# Patient Record
Sex: Female | Born: 1975
Health system: Southern US, Community
[De-identification: ages and names within clinical notes are randomized; demographics above are authoritative.]

## PROBLEM LIST (undated history)

## (undated) DIAGNOSIS — N200 Calculus of kidney: Secondary | ICD-10-CM

## (undated) DIAGNOSIS — N261 Atrophy of kidney (terminal): Secondary | ICD-10-CM

## (undated) DIAGNOSIS — K5090 Crohn's disease, unspecified, without complications: Secondary | ICD-10-CM

## (undated) HISTORY — PX: URETHRA SURGERY: SHX824

## (undated) HISTORY — PX: CARPAL TUNNEL RELEASE: SHX101

## (undated) HISTORY — DX: Atrophy of kidney (terminal): N26.1

## (undated) HISTORY — DX: Calculus of kidney: N20.0

## (undated) HISTORY — PX: OTHER SURGICAL HISTORY: SHX169

## (undated) HISTORY — DX: Crohn's disease, unspecified, without complications: K50.90

---

## 2010-05-05 DIAGNOSIS — K219 Gastro-esophageal reflux disease without esophagitis: Secondary | ICD-10-CM | POA: Insufficient documentation

## 2010-05-05 DIAGNOSIS — N390 Urinary tract infection, site not specified: Secondary | ICD-10-CM | POA: Insufficient documentation

## 2010-05-05 DIAGNOSIS — G5600 Carpal tunnel syndrome, unspecified upper limb: Secondary | ICD-10-CM | POA: Insufficient documentation

## 2010-05-05 DIAGNOSIS — R319 Hematuria, unspecified: Secondary | ICD-10-CM | POA: Insufficient documentation

## 2010-05-05 DIAGNOSIS — F5104 Psychophysiologic insomnia: Secondary | ICD-10-CM | POA: Insufficient documentation

## 2010-05-22 DIAGNOSIS — Z0000 Encounter for general adult medical examination without abnormal findings: Secondary | ICD-10-CM | POA: Insufficient documentation

## 2010-06-19 DIAGNOSIS — M545 Low back pain, unspecified: Secondary | ICD-10-CM | POA: Insufficient documentation

## 2010-06-22 DIAGNOSIS — K529 Noninfective gastroenteritis and colitis, unspecified: Secondary | ICD-10-CM | POA: Insufficient documentation

## 2010-07-06 DIAGNOSIS — F419 Anxiety disorder, unspecified: Secondary | ICD-10-CM | POA: Insufficient documentation

## 2010-10-07 DIAGNOSIS — M25519 Pain in unspecified shoulder: Secondary | ICD-10-CM | POA: Insufficient documentation

## 2010-10-07 DIAGNOSIS — B070 Plantar wart: Secondary | ICD-10-CM | POA: Insufficient documentation

## 2015-11-19 DIAGNOSIS — K50919 Crohn's disease, unspecified, with unspecified complications: Secondary | ICD-10-CM | POA: Diagnosis not present

## 2016-01-27 DIAGNOSIS — K50919 Crohn's disease, unspecified, with unspecified complications: Secondary | ICD-10-CM | POA: Diagnosis not present

## 2016-02-02 DIAGNOSIS — K5080 Crohn's disease of both small and large intestine without complications: Secondary | ICD-10-CM | POA: Diagnosis not present

## 2016-03-23 DIAGNOSIS — K50919 Crohn's disease, unspecified, with unspecified complications: Secondary | ICD-10-CM | POA: Diagnosis not present

## 2016-03-26 DIAGNOSIS — K5080 Crohn's disease of both small and large intestine without complications: Secondary | ICD-10-CM | POA: Diagnosis not present

## 2016-03-31 DIAGNOSIS — B070 Plantar wart: Secondary | ICD-10-CM | POA: Diagnosis not present

## 2016-03-31 DIAGNOSIS — R300 Dysuria: Secondary | ICD-10-CM | POA: Diagnosis not present

## 2016-03-31 DIAGNOSIS — L239 Allergic contact dermatitis, unspecified cause: Secondary | ICD-10-CM | POA: Diagnosis not present

## 2016-03-31 DIAGNOSIS — K50919 Crohn's disease, unspecified, with unspecified complications: Secondary | ICD-10-CM | POA: Diagnosis not present

## 2016-04-05 DIAGNOSIS — D649 Anemia, unspecified: Secondary | ICD-10-CM | POA: Diagnosis not present

## 2016-04-05 DIAGNOSIS — N390 Urinary tract infection, site not specified: Secondary | ICD-10-CM | POA: Diagnosis not present

## 2016-04-05 DIAGNOSIS — L239 Allergic contact dermatitis, unspecified cause: Secondary | ICD-10-CM | POA: Diagnosis not present

## 2016-04-05 DIAGNOSIS — J069 Acute upper respiratory infection, unspecified: Secondary | ICD-10-CM | POA: Diagnosis not present

## 2016-04-15 DIAGNOSIS — K5080 Crohn's disease of both small and large intestine without complications: Secondary | ICD-10-CM | POA: Diagnosis not present

## 2016-04-15 DIAGNOSIS — K589 Irritable bowel syndrome without diarrhea: Secondary | ICD-10-CM | POA: Diagnosis not present

## 2016-05-19 DIAGNOSIS — K50919 Crohn's disease, unspecified, with unspecified complications: Secondary | ICD-10-CM | POA: Diagnosis not present

## 2016-07-12 DIAGNOSIS — K50919 Crohn's disease, unspecified, with unspecified complications: Secondary | ICD-10-CM | POA: Diagnosis not present

## 2016-08-02 DIAGNOSIS — R7989 Other specified abnormal findings of blood chemistry: Secondary | ICD-10-CM | POA: Diagnosis not present

## 2016-08-02 DIAGNOSIS — K5080 Crohn's disease of both small and large intestine without complications: Secondary | ICD-10-CM | POA: Diagnosis not present

## 2016-08-02 DIAGNOSIS — K219 Gastro-esophageal reflux disease without esophagitis: Secondary | ICD-10-CM | POA: Diagnosis not present

## 2016-09-14 DIAGNOSIS — K50919 Crohn's disease, unspecified, with unspecified complications: Secondary | ICD-10-CM | POA: Diagnosis not present

## 2016-09-28 DIAGNOSIS — Z0001 Encounter for general adult medical examination with abnormal findings: Secondary | ICD-10-CM | POA: Diagnosis not present

## 2016-09-28 DIAGNOSIS — F17210 Nicotine dependence, cigarettes, uncomplicated: Secondary | ICD-10-CM | POA: Diagnosis not present

## 2016-09-28 DIAGNOSIS — R0609 Other forms of dyspnea: Secondary | ICD-10-CM | POA: Diagnosis not present

## 2016-10-04 DIAGNOSIS — H5213 Myopia, bilateral: Secondary | ICD-10-CM | POA: Diagnosis not present

## 2016-10-13 DIAGNOSIS — Z13220 Encounter for screening for lipoid disorders: Secondary | ICD-10-CM | POA: Diagnosis not present

## 2016-11-09 DIAGNOSIS — Z1231 Encounter for screening mammogram for malignant neoplasm of breast: Secondary | ICD-10-CM | POA: Diagnosis not present

## 2016-11-23 DIAGNOSIS — K50919 Crohn's disease, unspecified, with unspecified complications: Secondary | ICD-10-CM | POA: Diagnosis not present

## 2016-12-21 DIAGNOSIS — N390 Urinary tract infection, site not specified: Secondary | ICD-10-CM | POA: Diagnosis not present

## 2016-12-31 DIAGNOSIS — N390 Urinary tract infection, site not specified: Secondary | ICD-10-CM | POA: Diagnosis not present

## 2017-01-25 DIAGNOSIS — K50919 Crohn's disease, unspecified, with unspecified complications: Secondary | ICD-10-CM | POA: Diagnosis not present

## 2017-02-03 DIAGNOSIS — K219 Gastro-esophageal reflux disease without esophagitis: Secondary | ICD-10-CM | POA: Diagnosis not present

## 2017-02-03 DIAGNOSIS — Z862 Personal history of diseases of the blood and blood-forming organs and certain disorders involving the immune mechanism: Secondary | ICD-10-CM | POA: Diagnosis not present

## 2017-02-03 DIAGNOSIS — K5080 Crohn's disease of both small and large intestine without complications: Secondary | ICD-10-CM | POA: Diagnosis not present

## 2017-03-22 DIAGNOSIS — K50919 Crohn's disease, unspecified, with unspecified complications: Secondary | ICD-10-CM | POA: Diagnosis not present

## 2017-05-18 DIAGNOSIS — K50919 Crohn's disease, unspecified, with unspecified complications: Secondary | ICD-10-CM | POA: Diagnosis not present

## 2017-07-13 DIAGNOSIS — K50919 Crohn's disease, unspecified, with unspecified complications: Secondary | ICD-10-CM | POA: Diagnosis not present

## 2017-10-28 ENCOUNTER — Ambulatory Visit: Payer: BLUE CROSS/BLUE SHIELD | Admitting: Family Medicine

## 2017-10-28 ENCOUNTER — Encounter: Payer: Self-pay | Admitting: Family Medicine

## 2017-10-28 VITALS — BP 112/80 | HR 72 | Temp 97.7°F | Ht 61.0 in | Wt 129.0 lb

## 2017-10-28 DIAGNOSIS — K5090 Crohn's disease, unspecified, without complications: Secondary | ICD-10-CM

## 2017-10-28 DIAGNOSIS — N3001 Acute cystitis with hematuria: Secondary | ICD-10-CM | POA: Diagnosis not present

## 2017-10-28 DIAGNOSIS — Z7689 Persons encountering health services in other specified circumstances: Secondary | ICD-10-CM

## 2017-10-28 DIAGNOSIS — R82998 Other abnormal findings in urine: Secondary | ICD-10-CM

## 2017-10-28 LAB — POC URINALSYSI DIPSTICK (AUTOMATED)
Bilirubin, UA: NEGATIVE
GLUCOSE UA: NEGATIVE
KETONES UA: NEGATIVE
Nitrite, UA: NEGATIVE
Protein, UA: POSITIVE — AB
SPEC GRAV UA: 1.015 (ref 1.010–1.025)
Urobilinogen, UA: 0.2 E.U./dL
pH, UA: 6 (ref 5.0–8.0)

## 2017-10-28 MED ORDER — SULFAMETHOXAZOLE-TRIMETHOPRIM 800-160 MG PO TABS: 1.0000 | ORAL_TABLET | Freq: Two times a day (BID) | ORAL | 0 refills | Status: AC

## 2017-10-28 NOTE — Patient Instructions (Addendum)
Crohn Disease Crohn disease is a long-lasting (chronic) disease that affects your gastrointestinal (GI) tract. It often causes irritation and swelling (inflammation) in your small intestine and the beginning of your large intestine. However, it can affect any part of your GI tract. Crohn disease is part of a group of illnesses that are known as inflammatory bowel disease (IBD). Crohn disease may start slowly and get worse over time. Symptoms may come and go. They may also disappear for months or even years at a time (remission). What are the causes? The exact cause of Crohn disease is not known. It may be a response that causes your body's defense system (immune system) to mistakenly attack healthy cells and tissues (autoimmune response). Your genes and your environment may also play a role. What increases the risk? You may be at greater risk for Crohn disease if you:  Have other family members with Crohn disease or another IBD.  Use any tobacco products, including cigarettes, chewing tobacco, or electronic cigarettes.  Are in your 78s.  Have [place] European ancestry.  What are the signs or symptoms? The main signs and symptoms of Crohn disease involve your GI tract. These include:  Diarrhea.  Rectal bleeding.  An urgent need to move your bowels.  The feeling that you are not finished having a bowel movement.  Abdominal pain or cramping.  Constipation.  General signs and symptoms of Crohn disease may also include:  Unexplained weight loss.  Fatigue.  Fever.  Nausea.  Loss of appetite.  Joint pain  Changes in vision.  Red bumps on your skin.  How is this diagnosed? Your health care provider may suspect Crohn disease based on your symptoms and your medical history. Your health care provider will do a physical exam. You may need to see a health care provider who specializes in diseases of the digestive tract (gastroenterologist). You may also have tests to help your  health care providers make a diagnosis. These may include:  Blood tests.  Stool sample tests.  Imaging tests, such as X-rays and CT scans.  Tests to examine the inside of your intestines using a long, flexible tube that has a light and a camera on the end (endoscopy or colonoscopy).  A procedure to take tissue samples from inside your bowel (biopsy) to be examined under a microscope.  How is this treated? There is no cure for Crohn disease. Treatment will focus on managing your symptoms. Crohn disease affects each person differently. Your treatment may include:  Resting your bowels. Drinking only clear liquids or getting nutrition through an IV for a period of time gives your bowels a chance to heal because they are not passing stools.  Medicines. These may be used alone or in combination (combination therapy). These may include antibiotic medicines. You may be given medicines that help to: ? Reduce inflammation. ? Control your immune system activity. ? Fight infections. ? Relieve cramps and prevent diarrhea. ? Control your pain.  Surgery. You may need surgery if: ? Medicines and other treatments are no longer working. ? You develop complications from severe Crohn disease. ? A section of your intestine becomes so damaged that it needs to be removed.  Follow these instructions at home:  Take medicines only as directed by your health care provider.  If you were prescribed an antibiotic medicine, finish it all even if you start to feel better.  Keep all follow-up visits as directed by your health care provider. This is important.  Talk with your  health care provider about changing your diet. This may help your symptoms. Your health care provide may recommend changes, such as: ? Drinking more fluids. ? Avoiding milk and other foods that contain lactose. ? Eating a low-fat diet. ? Avoiding high-fiber foods, such as popcorn and nuts. ? Avoiding carbonated beverages, such as  soda. ? Eating smaller meals more often rather than eating large meals. ? Keeping a food diary to identify foods that make your symptoms better or worse.  Do not use any tobacco products, including cigarettes, chewing tobacco, or electronic cigarettes. If you need help quitting, ask your health care provider.  Limit alcohol intake to no more than 1 drink per day for nonpregnant women and 2 drinks per day for men. One drink equals 12 ounces of beer, 5 ounces of wine, or 1 ounces of hard liquor.  Exercise daily or as directed by your health care provider. Contact a health care provider if:  You have diarrhea, abdominal cramps, and other gastrointestinal problems that are present almost all of the time.  Your symptoms do not improve with treatment.  You continue to lose weight.  You develop a rash or sores on your skin.  You develop eye problems.  You have a fever.  Your symptoms get worse.  You develop new symptoms. Get help right away if:  You have bloody diarrhea.  You develop severe abdominal pain.  You cannot pass stools. This information is not intended to replace advice given to you by your health care provider. Make sure you discuss any questions you have with your health care provider. Document Released: 02/10/2005 Document Revised: 09/11/2015 Document Reviewed: 12/19/2013 Elsevier Interactive Patient Education  2018 [employer].  Urinary Tract Infection, Adult A urinary tract infection (UTI) is an infection of any part of the urinary tract, which includes the kidneys, ureters, bladder, and urethra. These organs make, store, and get rid of urine in the body. UTI can be a bladder infection (cystitis) or kidney infection (pyelonephritis). What are the causes? This infection may be caused by fungi, viruses, or bacteria. Bacteria are the most common cause of UTIs. This condition can also be caused by repeated incomplete emptying of the bladder during urination. What  increases the risk? This condition is more likely to develop if:  You ignore your need to urinate or hold urine for long periods of time.  You do not empty your bladder completely during urination.  You wipe back to front after urinating or having a bowel movement, if you are female.  You are uncircumcised, if you are female.  You are constipated.  You have a urinary catheter that stays in place (indwelling).  You have a weak defense (immune) system.  You have a medical condition that affects your bowels, kidneys, or bladder.  You have diabetes.  You take antibiotic medicines frequently or for long periods of time, and the antibiotics no longer work well against certain types of infections (antibiotic resistance).  You take medicines that irritate your urinary tract.  You are exposed to chemicals that irritate your urinary tract.  You are female.  What are the signs or symptoms? Symptoms of this condition include:  Fever.  Frequent urination or passing small amounts of urine frequently.  Needing to urinate urgently.  Pain or burning with urination.  Urine that smells bad or unusual.  Cloudy urine.  Pain in the lower abdomen or back.  Trouble urinating.  Blood in the urine.  Vomiting or being less hungry than  normal.  Diarrhea or abdominal pain.  Vaginal discharge, if you are female.  How is this diagnosed? This condition is diagnosed with a medical history and physical exam. You will also need to provide a urine sample to test your urine. Other tests may be done, including:  Blood tests.  Sexually transmitted disease (STD) testing.  If you have had more than one UTI, a cystoscopy or imaging studies may be done to determine the cause of the infections. How is this treated? Treatment for this condition often includes a combination of two or more of the following:  Antibiotic medicine.  Other medicines to treat less common causes of  UTI.  Over-the-counter medicines to treat pain.  Drinking enough water to stay hydrated.  Follow these instructions at home:  Take over-the-counter and prescription medicines only as told by your health care provider.  If you were prescribed an antibiotic, take it as told by your health care provider. Do not stop taking the antibiotic even if you start to feel better.  Avoid alcohol, caffeine, tea, and carbonated beverages. They can irritate your bladder.  Drink enough fluid to keep your urine clear or pale yellow.  Keep all follow-up visits as told by your health care provider. This is important.  Make sure to: ? Empty your bladder often and completely. Do not hold urine for long periods of time. ? Empty your bladder before and after sex. ? Wipe from front to back after a bowel movement if you are female. Use each tissue one time when you wipe. Contact a health care provider if:  You have back pain.  You have a fever.  You feel nauseous or vomit.  Your symptoms do not get better after 3 days.  Your symptoms go away and then return. Get help right away if:  You have severe back pain or lower abdominal pain.  You are vomiting and cannot keep down any medicines or water. This information is not intended to replace advice given to you by your health care provider. Make sure you discuss any questions you have with your health care provider. Document Released: 02/10/2005 Document Revised: 10/15/2015 Document Reviewed: 03/24/2015 Elsevier Interactive Patient Education  [employer].

## 2017-10-28 NOTE — Progress Notes (Signed)
Patient presents to clinic today to establish care.  SUBJECTIVE: PMH: Pt is a 42 yo with pmh sig for heartburn, Crohn's disease, kidney stones.  Pt was previously seen in [facility].  Dark urine: -Patient endorses dark-colored urine times several weeks -She is trying to increase her p.o. intake of water to 4-6 bottles per day -Patient denies back pain, questionable dysuria.  Crohn's disease: -Patient followed by GI. -Has been receiving Remicade injections q 8 weeks -Currently stable -Needs to gastroenterologist.  Allergies: NKDA  Past surgical history: Removal of scar tissue from urethra 2013  Social history: Patient is single.  She currently works as a Information systems manager.  Patient endorses social alcohol use and recreational drug use.  Patient denies current tobacco use, though smoked cigarettes in the past.  Health Maintenance: Immunizations --tetanus vaccine 2018, influenza vaccine 2018, TB test 2016 Colonoscopy --2016 Mammogram --2018 PAP -- 2015, h/o abnormal  No Known Allergies  No family history on file.  Social History   Socioeconomic History  . Marital status: Unknown    Spouse name: Not on file  . Number of children: Not on file  . Years of education: Not on file  . Highest education level: Not on file  Occupational History  . Not on file  Social Needs  . Financial resource strain: Not on file  . Food insecurity:    Worry: Not on file    Inability: Not on file  . Transportation needs:    Medical: Not on file    Non-medical: Not on file  Tobacco Use  . Smoking status: Former Research scientist (life sciences)  . Smokeless tobacco: Former Network engineer and Sexual Activity  . Alcohol use: Yes    Comment: OCCASSIONAL  . Drug use: Yes    Types: Marijuana    Comment: OCCASSIONAL  . Sexual activity: Not Currently  Lifestyle  . Physical activity:    Days per week: Not on file    Minutes per session: Not on file  . Stress: Not on file  Relationships  .  Social connections:    Talks on phone: Not on file    Gets together: Not on file    Attends religious service: Not on file    Active member of club or organization: Not on file    Attends meetings of clubs or organizations: Not on file    Relationship status: Not on file  . Intimate partner violence:    Fear of current or ex partner: Not on file    Emotionally abused: Not on file    Physically abused: Not on file    Forced sexual activity: Not on file  Other Topics Concern  . Not on file  Social History Narrative  . Not on file    ROS General: Denies fever, chills, night sweats, changes in weight, changes in appetite HEENT: Denies headaches, ear pain, changes in vision, rhinorrhea, sore throat CV: Denies CP, palpitations, SOB, orthopnea Pulm: Denies SOB, cough, wheezing GI: Denies abdominal pain, nausea, vomiting, diarrhea, constipation GU: Denies dysuria, hematuria, frequency, vaginal discharge  +dark urine Msk: Denies muscle cramps, joint pains Neuro: Denies weakness, numbness, tingling Skin: Denies rashes, bruising Psych: Denies depression, anxiety, hallucinations  BP 112/80 (BP Location: Left Arm, Patient Position: Sitting, Cuff Size: Normal)   Pulse 72   Temp 97.7 F (36.5 C) (Oral)   Ht 5' 1"  (1.549 m)   Wt 129 lb (58.5 kg)   LMP 10/23/2017 (Exact Date)   SpO2 98%  BMI 24.37 kg/m   Physical Exam Gen. Pleasant, well developed, well-nourished, in NAD HEENT - Coin/AT, PERRL, no scleral icterus, no nasal drainage, pharynx without erythema or exudate.  TMs normal bilaterally.  No cervical lymphadenopathy. Lungs: no use of accessory muscles, CTAB, no wheezes, rales or rhonchi Cardiovascular: RRR, No r/g/m, no peripheral edema Abdomen: BS present, soft, nontender, nondistended Neuro:  A&Ox3, CN II-XII intact, normal gait   Recent Results (from the past 2160 hour(s))  POCT Urinalysis Dipstick (Automated)     Status: Abnormal   Collection Time: 10/28/17  2:12 PM    Result Value Ref Range   Color, UA PALE YELLOW    Clarity, UA CLOUDY    Glucose, UA Negative Negative   Bilirubin, UA NEG    Ketones, UA NEG    Spec Grav, UA 1.015 1.010 - 1.025   Blood, UA 1+    pH, UA 6.0 5.0 - 8.0   Protein, UA Positive (A) Negative   Urobilinogen, UA 0.2 0.2 or 1.0 E.U./dL   Nitrite, UA NEG    Leukocytes, UA Moderate (2+) (A) Negative    Assessment/Plan: Acute cystitis with hematuria  -UA with 1+ blood, protein, 2+ leukocytes, SG 1.015 -Given handout - Plan: sulfamethoxazole-trimethoprim (BACTRIM DS,SEPTRA DS) 800-160 MG tablet  Crohn's disease without complication, unspecified gastrointestinal tract location [facility]) - Plan: Ambulatory referral to Gastroenterology  Dark urine  - Plan: POCT Urinalysis Dipstick (Automated), Urine Culture, Urine Culture  Encounter to establish care -We reviewed the PMH, PSH, FH, SH, Meds and Allergies. -We provided refills for any medications we will prescribe as needed. -We addressed current concerns per orders and patient instructions. -We have asked for records for pertinent exams, studies, vaccines and notes from previous providers. -We have advised patient to follow up per instructions below.  F/u prn  Grier Mitts, MD

## 2017-10-30 LAB — URINE CULTURE
MICRO NUMBER:: 90716119
SPECIMEN QUALITY:: ADEQUATE

## 2017-11-03 ENCOUNTER — Encounter: Payer: BLUE CROSS/BLUE SHIELD | Admitting: Family Medicine

## 2017-11-04 ENCOUNTER — Ambulatory Visit (INDEPENDENT_AMBULATORY_CARE_PROVIDER_SITE_OTHER): Payer: BLUE CROSS/BLUE SHIELD | Admitting: Family Medicine

## 2017-11-04 ENCOUNTER — Other Ambulatory Visit (HOSPITAL_COMMUNITY)
Admission: RE | Admit: 2017-11-04 | Discharge: 2017-11-04 | Disposition: A | Discharge status: Home / Self Care | Payer: BLUE CROSS/BLUE SHIELD | Source: Ambulatory Visit | Attending: Family Medicine | Admitting: Family Medicine

## 2017-11-04 ENCOUNTER — Encounter: Payer: Self-pay | Admitting: Family Medicine

## 2017-11-04 VITALS — BP 98/70 | HR 78 | Temp 98.7°F | Ht 61.0 in | Wt 129.0 lb

## 2017-11-04 DIAGNOSIS — Z0000 Encounter for general adult medical examination without abnormal findings: Secondary | ICD-10-CM

## 2017-11-04 DIAGNOSIS — B9689 Other specified bacterial agents as the cause of diseases classified elsewhere: Secondary | ICD-10-CM | POA: Insufficient documentation

## 2017-11-04 DIAGNOSIS — Z13220 Encounter for screening for lipoid disorders: Secondary | ICD-10-CM | POA: Diagnosis not present

## 2017-11-04 DIAGNOSIS — Z113 Encounter for screening for infections with a predominantly sexual mode of transmission: Secondary | ICD-10-CM

## 2017-11-04 DIAGNOSIS — N760 Acute vaginitis: Secondary | ICD-10-CM | POA: Diagnosis not present

## 2017-11-04 DIAGNOSIS — Z1329 Encounter for screening for other suspected endocrine disorder: Secondary | ICD-10-CM

## 2017-11-04 DIAGNOSIS — Z131 Encounter for screening for diabetes mellitus: Secondary | ICD-10-CM

## 2017-11-04 LAB — CBC WITH DIFFERENTIAL/PLATELET
BASOS PCT: 0.9 % (ref 0.0–3.0)
Basophils Absolute: 0 10*3/uL (ref 0.0–0.1)
EOS ABS: 0 10*3/uL (ref 0.0–0.7)
Eosinophils Relative: 0.3 % (ref 0.0–5.0)
HCT: 37.5 % (ref 36.0–46.0)
Hemoglobin: 12.3 g/dL (ref 12.0–15.0)
LYMPHS ABS: 1.5 10*3/uL (ref 0.7–4.0)
Lymphocytes Relative: 31.3 % (ref 12.0–46.0)
MCHC: 32.8 g/dL (ref 30.0–36.0)
MCV: 82 fl (ref 78.0–100.0)
MONO ABS: 0.6 10*3/uL (ref 0.1–1.0)
Monocytes Relative: 11.4 % (ref 3.0–12.0)
NEUTROS ABS: 2.8 10*3/uL (ref 1.4–7.7)
NEUTROS PCT: 56.1 % (ref 43.0–77.0)
PLATELETS: 285 10*3/uL (ref 150.0–400.0)
RBC: 4.57 Mil/uL (ref 3.87–5.11)
RDW: 18.4 % — AB (ref 11.5–15.5)
WBC: 4.9 10*3/uL (ref 4.0–10.5)

## 2017-11-04 LAB — LIPID PANEL
CHOLESTEROL: 147 mg/dL (ref 0–200)
HDL: 46.1 mg/dL (ref >39.00)
LDL Cholesterol: 89 mg/dL (ref 0–99)
NONHDL: 100.62
Total CHOL/HDL Ratio: 3
Triglycerides: 60 mg/dL (ref 0.0–149.0)
VLDL: 12 mg/dL (ref 0.0–40.0)

## 2017-11-04 LAB — BASIC METABOLIC PANEL
BUN: 8 mg/dL (ref 6–23)
CO2: 26 mEq/L (ref 19–32)
Calcium: 9.2 mg/dL (ref 8.4–10.5)
Chloride: 105 mEq/L (ref 96–112)
Creatinine, Ser: 1.17 mg/dL (ref 0.40–1.20)
GFR: 53.94 mL/min — AB (ref >60.00)
Glucose, Bld: 95 mg/dL (ref 70–99)
POTASSIUM: 4.1 meq/L (ref 3.5–5.1)
SODIUM: 138 meq/L (ref 135–145)

## 2017-11-04 LAB — TSH: TSH: 1.15 u[IU]/mL (ref 0.35–4.50)

## 2017-11-04 LAB — HEMOGLOBIN A1C: Hgb A1c MFr Bld: 5.7 % (ref 4.6–6.5)

## 2017-11-04 LAB — T4, FREE: FREE T4: 0.81 ng/dL (ref 0.60–1.60)

## 2017-11-04 NOTE — Patient Instructions (Signed)
Preventive Care 40-64 Years, Female Preventive care refers to lifestyle choices and visits with your health care provider that can promote health and wellness. What does preventive care include?  A yearly physical exam. This is also called an annual well check.  Dental exams once or twice a year.  Routine eye exams. Ask your health care provider how often you should have your eyes checked.  Personal lifestyle choices, including: ? Daily care of your teeth and gums. ? Regular physical activity. ? Eating a healthy diet. ? Avoiding tobacco and drug use. ? Limiting alcohol use. ? Practicing safe sex. ? Taking low-dose aspirin daily starting at age 42. ? Taking vitamin and mineral supplements as recommended by your health care provider. What happens during an annual well check? The services and screenings done by your health care provider during your annual well check will depend on your age, overall health, lifestyle risk factors, and family history of disease. Counseling Your health care provider may ask you questions about your:  Alcohol use.  Tobacco use.  Drug use.  Emotional well-being.  Home and relationship well-being.  Sexual activity.  Eating habits.  Work and work Statistician.  Method of birth control.  Menstrual cycle.  Pregnancy history.  Screening You may have the following tests or measurements:  Height, weight, and BMI.  Blood pressure.  Lipid and cholesterol levels. These may be checked every 5 years, or more frequently if you are over 42 years old.  Skin check.  Lung cancer screening. You may have this screening every year starting at age 42 if you have a 30-pack-year history of smoking and currently smoke or have quit within the past 15 years.  Fecal occult blood test (FOBT) of the stool. You may have this test every year starting at age 42.  Flexible sigmoidoscopy or colonoscopy. You may have a sigmoidoscopy every 5 years or a colonoscopy  every 10 years starting at age 42.  Hepatitis C blood test.  Hepatitis B blood test.  Sexually transmitted disease (STD) testing.  Diabetes screening. This is done by checking your blood sugar (glucose) after you have not eaten for a while (fasting). You may have this done every 1-3 years.  Mammogram. This may be done every 1-2 years. Talk to your health care provider about when you should start having regular mammograms. This may depend on whether you have a family history of breast cancer.  BRCA-related cancer screening. This may be done if you have a family history of breast, ovarian, tubal, or peritoneal cancers.  Pelvic exam and Pap test. This may be done every 3 years starting at age 42. Starting at age 36, this may be done every 5 years if you have a Pap test in combination with an HPV test.  Bone density scan. This is done to screen for osteoporosis. You may have this scan if you are at high risk for osteoporosis.  Discuss your test results, treatment options, and if necessary, the need for more tests with your health care provider. Vaccines Your health care provider may recommend certain vaccines, such as:  Influenza vaccine. This is recommended every year.  Tetanus, diphtheria, and acellular pertussis (Tdap, Td) vaccine. You may need a Td booster every 10 years.  Varicella vaccine. You may need this if you have not been vaccinated.  Zoster vaccine. You may need this after age 42.  Measles, mumps, and rubella (MMR) vaccine. You may need at least one dose of MMR if you were born in  1957 or later. You may also need a second dose.  Pneumococcal 13-valent conjugate (PCV13) vaccine. You may need this if you have certain conditions and were not previously vaccinated.  Pneumococcal polysaccharide (PPSV23) vaccine. You may need one or two doses if you smoke cigarettes or if you have certain conditions.  Meningococcal vaccine. You may need this if you have certain  conditions.  Hepatitis A vaccine. You may need this if you have certain conditions or if you travel or work in places where you may be exposed to hepatitis A.  Hepatitis B vaccine. You may need this if you have certain conditions or if you travel or work in places where you may be exposed to hepatitis B.  Haemophilus influenzae type b (Hib) vaccine. You may need this if you have certain conditions.  Talk to your health care provider about which screenings and vaccines you need and how often you need them. This information is not intended to replace advice given to you by your health care provider. Make sure you discuss any questions you have with your health care provider. Document Released: 05/30/2015 Document Revised: 01/21/2016 Document Reviewed: 03/04/2015 Elsevier Interactive Patient Education  2018 Elsevier Inc.  

## 2017-11-04 NOTE — Progress Notes (Signed)
Subjective:     Amanda Klein is a 42 y.o. female and is here for a comprehensive physical exam. The patient reports no problems.  H/o Crohn's dz.  Pt states she is due for Remicaid infusinon but has not been scheduled by GI.  Pt receives infusion q 8 wks.  Social History   Socioeconomic History  . Marital status: Single    Spouse name: Not on file  . Number of children: Not on file  . Years of education: Not on file  . Highest education level: Not on file  Occupational History  . Not on file  Social Needs  . Financial resource strain: Not on file  . Food insecurity:    Worry: Not on file    Inability: Not on file  . Transportation needs:    Medical: Not on file    Non-medical: Not on file  Tobacco Use  . Smoking status: Former Research scientist (life sciences)  . Smokeless tobacco: Former Network engineer and Sexual Activity  . Alcohol use: Yes    Comment: OCCASSIONAL  . Drug use: Yes    Types: Marijuana    Comment: OCCASSIONAL  . Sexual activity: Not Currently  Lifestyle  . Physical activity:    Days per week: Not on file    Minutes per session: Not on file  . Stress: Not on file  Relationships  . Social connections:    Talks on phone: Not on file    Gets together: Not on file    Attends religious service: Not on file    Active member of club or organization: Not on file    Attends meetings of clubs or organizations: Not on file    Relationship status: Not on file  . Intimate partner violence:    Fear of current or ex partner: Not on file    Emotionally abused: Not on file    Physically abused: Not on file    Forced sexual activity: Not on file  Other Topics Concern  . Not on file  Social History Narrative  . Not on file   Health Maintenance  Topic Date Due  . HIV Screening  12/03/1990  . TETANUS/TDAP  12/03/1994  . PAP SMEAR  12/02/1996  . INFLUENZA VACCINE  12/15/2017    The following portions of the patient's history were reviewed and updated as appropriate: allergies,  current medications, past family history, past medical history, past social history, past surgical history and problem list.  Review of Systems A comprehensive review of systems was negative.   Objective:    BP 98/70 (BP Location: Left Arm, Patient Position: Sitting, Cuff Size: Normal)   Pulse 78   Temp 98.7 F (37.1 C) (Oral)   Ht 5' 1"  (1.549 m)   Wt 129 lb (58.5 kg)   LMP 10/23/2017 (Exact Date)   SpO2 98%   BMI 24.37 kg/m  General appearance: alert, cooperative and no distress Head: Normocephalic, without obvious abnormality, atraumatic Eyes: conjunctivae/corneas clear. PERRL, EOM's intact. Fundi benign. Ears: normal TM's and external ear canals both ears Nose: Nares normal. Septum midline. Mucosa normal. No drainage or sinus tenderness. Throat: lips, mucosa, and tongue normal; teeth and gums normal Neck: no adenopathy, no JVD, supple, symmetrical, trachea midline and thyroid not enlarged, symmetric, no tenderness/mass/nodules Lungs: clear to auscultation bilaterally Heart: regular rate and rhythm, S1, S2 normal, no murmur, click, rub or gallop Abdomen: soft, non-tender; bowel sounds normal; no masses,  no organomegaly Pelvic: cervix normal in appearance, external genitalia normal,  no adnexal masses or tenderness, no cervical motion tenderness, uterus normal size, shape, and consistency, vagina normal without discharge and nabothian cyst of cervix  Extremities: extremities normal, atraumatic, no cyanosis or edema Skin: Skin color, texture, turgor normal. No rashes or lesions Neurologic: Alert and oriented X 3, normal strength and tone. Normal symmetric reflexes. Normal coordination and gait    Assessment:    Healthy female exam.      Plan:     Anticipatory guidance given including wearing seatbelts, smoke detectors in the home, increasing physical activity, increasing p.o. intake of water and vegetables. -pap done this visit -mammogram due later this yr -will obtain labs  this visit BMP, CBC, lipid panel -given handout -next CPE in 1 yr See After Visit Summary for Counseling Recommendations    STI screening  -will obtain labs for RPR, HIV, GC testing  Screen for diabetes -obtain Hgb A1C  F/u prn.  Discussed obtaining records from previous GI provider in [place], [place].  [facility] awaiting records prior to scheduling Remicade infusion.  Grier Mitts, MD

## 2017-11-07 ENCOUNTER — Telehealth: Payer: Self-pay | Admitting: Gastroenterology

## 2017-11-07 LAB — RPR: RPR Ser Ql: NONREACTIVE

## 2017-11-07 LAB — HIV ANTIBODY (ROUTINE TESTING W REFLEX): HIV 1&2 Ab, 4th Generation: NONREACTIVE

## 2017-11-07 NOTE — Telephone Encounter (Signed)
The pt will have her records sent here for review and an appt scheduled if appropriate and she can discuss with the Dr.

## 2017-11-08 ENCOUNTER — Other Ambulatory Visit: Payer: Self-pay | Admitting: Family Medicine

## 2017-11-08 ENCOUNTER — Telehealth: Payer: Self-pay

## 2017-11-08 DIAGNOSIS — N760 Acute vaginitis: Principal | ICD-10-CM

## 2017-11-08 DIAGNOSIS — B9689 Other specified bacterial agents as the cause of diseases classified elsewhere: Secondary | ICD-10-CM

## 2017-11-08 LAB — CYTOLOGY - PAP
Bacterial vaginitis: POSITIVE — AB
CANDIDA VAGINITIS: NEGATIVE
CHLAMYDIA, DNA PROBE: NEGATIVE
DIAGNOSIS: NEGATIVE
HPV: NOT DETECTED
Neisseria Gonorrhea: NEGATIVE
TRICH (WINDOWPATH): NEGATIVE

## 2017-11-08 MED ORDER — METRONIDAZOLE 500 MG PO TABS: 500.0000 mg | ORAL_TABLET | Freq: Two times a day (BID) | ORAL | 0 refills | Status: AC

## 2017-11-08 NOTE — Telephone Encounter (Signed)
ROI faxed to Dr.Tate for records

## 2017-11-10 LAB — CERVICOVAGINAL ANCILLARY ONLY: Herpes: NEGATIVE

## 2017-11-11 NOTE — Telephone Encounter (Signed)
Rec'd from [place] forwarded 13 pages to Dr. Grier Mitts

## 2017-11-22 DIAGNOSIS — K50919 Crohn's disease, unspecified, with unspecified complications: Secondary | ICD-10-CM | POA: Diagnosis not present

## 2017-11-27 DIAGNOSIS — N390 Urinary tract infection, site not specified: Secondary | ICD-10-CM | POA: Diagnosis not present

## 2017-11-30 ENCOUNTER — Telehealth: Payer: Self-pay | Admitting: Gastroenterology

## 2017-11-30 NOTE — Telephone Encounter (Signed)
We have received pt's records. They will be sent to Dr. Ardis Hughs' for review.

## 2017-11-30 NOTE — Telephone Encounter (Signed)
Error

## 2017-12-02 NOTE — Telephone Encounter (Signed)
LM on Vmail to call back.  Dr. Eugenia Pancoast reviewed records and wants to know if patient wants to change her GI care to Dr. Ardis Hughs or stay with her current GI Dr. In [place] and just have her Remicade locally?

## 2017-12-05 NOTE — Telephone Encounter (Signed)
Pt returned call and wants to switch GI care to Dr. Ardis Hughs, she does not want to return to the GI Dr. In [place].  Records placed on Dr. Ardis Hughs desk

## 2017-12-08 ENCOUNTER — Ambulatory Visit: Payer: Self-pay

## 2017-12-08 ENCOUNTER — Ambulatory Visit: Payer: BLUE CROSS/BLUE SHIELD | Admitting: Family Medicine

## 2017-12-08 ENCOUNTER — Encounter: Payer: Self-pay | Admitting: Family Medicine

## 2017-12-08 ENCOUNTER — Other Ambulatory Visit: Payer: BLUE CROSS/BLUE SHIELD

## 2017-12-08 VITALS — BP 120/60 | HR 88 | Temp 98.4°F | Ht 61.0 in | Wt 129.0 lb

## 2017-12-08 DIAGNOSIS — R300 Dysuria: Secondary | ICD-10-CM | POA: Diagnosis not present

## 2017-12-08 LAB — POCT URINALYSIS DIPSTICK
Bilirubin, UA: NEGATIVE
Blood, UA: NEGATIVE
Glucose, UA: NEGATIVE
Ketones, UA: NEGATIVE
Leukocytes, UA: NEGATIVE
Nitrite, UA: NEGATIVE
PH UA: 6 (ref 5.0–8.0)
PROTEIN UA: NEGATIVE
Spec Grav, UA: 1.01 (ref 1.010–1.025)
UROBILINOGEN UA: 0.2 U/dL

## 2017-12-08 MED ORDER — FLUCONAZOLE 150 MG PO TABS: 150.0000 mg | ORAL_TABLET | Freq: Once | ORAL | 0 refills | Status: AC

## 2017-12-08 NOTE — Telephone Encounter (Signed)
Returned call to pt.  C/o burning, urgency, and frequency with urination.  Stated "it feels like I can't empty my bladder completely."   Stated had temp. Yesterday of 99 degrees.  Hasn't checked temperature today.  Stated she had a UTI a couple weeks ago, and feels like this has not cleared up.  Requesting to have UA today.  Advised she will need to be seen in the office first.  Appt. Given at 3:00 PM @ [facility], since PCP office has no openings.  Agreed with plan.   Reason for Disposition . Urinating more frequently than usual (i.e., frequency)  Answer Assessment - Initial Assessment Questions 1. SYMPTOM: "What's the main symptom you're concerned about?" (e.g., frequency, incontinence)     Burning , urgency, and frequency with urination 2. ONSET: "When did the  symptoms  start?"     Had a UTI a couple weeks ago and the sx's never went away 3. PAIN: "Is there any pain?" If so, ask: "How bad is it?" (Scale: 1-10; mild, moderate, severe)     Denied pain, "just irritation 4. CAUSE: "What do you think is causing the symptoms?"    UTI 5. OTHER SYMPTOMS: "Do you have any other symptoms?" (e.g., fever, flank pain, blood in urine, pain with urination)     Denied back or flank pain; denied blood in urine; temp. 99 degrees 7/24; denied fever today; doesn't feel like emptying bladder;   6. PREGNANCY: "Is there any chance you are pregnant?" "When was your last menstrual period?"     LMP 7/8-7/12  Protocols used: URINARY [facility]

## 2017-12-08 NOTE — Assessment & Plan Note (Signed)
Dipstick not showing an infection. Culture last month didn't grow one bacteria. History of urethra surgery from kidney stones when she was living in [place].  - urine culture  - diflucan  - given indications to follow up.

## 2017-12-08 NOTE — Progress Notes (Signed)
Amanda Klein - 42 y.o. female MRN [id number]  Date of birth: [birth year]  SUBJECTIVE:  Including CC & ROS.  No chief complaint on file.   Amanda Klein is a 42 y.o. female that is presenting with urinary complaints. Has been present for a few days. Feels pain after voiding. Mild itching and no discharge. No history of BV. Has a new sexual partner but has not had intercourse. No bleeding or flank pain.   Review of Systems  Constitutional: Negative for fever.  HENT: Negative for congestion.   Respiratory: Negative for cough.   Cardiovascular: Negative for chest pain.  Genitourinary: Positive for dysuria.    HISTORY: Past Medical, Surgical, Social, and Family History Reviewed & Updated per EMR.   Pertinent Historical Findings include:  History reviewed. No pertinent past medical history.  History reviewed. No pertinent surgical history.  No Known Allergies  History reviewed. No pertinent family history.   Social History   Socioeconomic History  . Marital status: Single    Spouse name: Not on file  . Number of children: Not on file  . Years of education: Not on file  . Highest education level: Not on file  Occupational History  . Not on file  Social Needs  . Financial resource strain: Not on file  . Food insecurity:    Worry: Not on file    Inability: Not on file  . Transportation needs:    Medical: Not on file    Non-medical: Not on file  Tobacco Use  . Smoking status: Former Research scientist (life sciences)  . Smokeless tobacco: Former Network engineer and Sexual Activity  . Alcohol use: Yes    Comment: OCCASSIONAL  . Drug use: Yes    Types: Marijuana    Comment: OCCASSIONAL  . Sexual activity: Not Currently  Lifestyle  . Physical activity:    Days per week: Not on file    Minutes per session: Not on file  . Stress: Not on file  Relationships  . Social connections:    Talks on phone: Not on file    Gets together: Not on file    Attends religious service: Not on file   Active member of club or organization: Not on file    Attends meetings of clubs or organizations: Not on file    Relationship status: Not on file  . Intimate partner violence:    Fear of current or ex partner: Not on file    Emotionally abused: Not on file    Physically abused: Not on file    Forced sexual activity: Not on file  Other Topics Concern  . Not on file  Social History Narrative  . Not on file     PHYSICAL EXAM:  VS: BP 120/60 (BP Location: Left Arm, Patient Position: Sitting, Cuff Size: Normal)   Pulse 88   Temp 98.4 F (36.9 C) (Oral)   Ht 5' 1"  (1.549 m)   Wt 129 lb (58.5 kg)   SpO2 99%   BMI 24.37 kg/m  Physical Exam Gen: NAD, alert, cooperative with exam, well-appearing ENT: normal lips, normal nasal mucosa,  Eye: normal EOM, normal conjunctiva and lids CV:  no edema, +2 pedal pulses   Resp: no accessory muscle use, non-labored,  GI: no masses or tenderness, no hernia, no suprapubic tenderness  Skin: no rashes, no areas of induration  Neuro: normal tone, normal sensation to touch Psych:  normal insight, alert and oriented MSK: normal gait, normal strength  ASSESSMENT & PLAN:   Dysuria Dipstick not showing an infection. Culture last month didn't grow one bacteria. History of urethra surgery from kidney stones when she was living in [place].  - urine culture  - diflucan  - given indications to follow up.

## 2017-12-08 NOTE — Patient Instructions (Signed)
Nice to meet you  We'll call you with the results.  Try the diflucan  Please follow up with your primary doctor if your symptoms persist

## 2017-12-09 LAB — URINE CULTURE
MICRO NUMBER:: 90881516
SPECIMEN QUALITY:: ADEQUATE

## 2017-12-12 ENCOUNTER — Telehealth: Payer: Self-pay | Admitting: Family Medicine

## 2017-12-12 NOTE — Telephone Encounter (Signed)
Left VM for patient. If she calls back please have her speak with a nurse/CMA and inform that her urine cutlure didn't grow any bacteria. The [facility] can report results to patient.   If any questions then please take the best time and phone number to call and I will try to call her back.   Rosemarie Ax, MD [facility] 12/12/2017, 9:07 AM

## 2017-12-12 NOTE — Telephone Encounter (Signed)
Patient notified of her results 

## 2017-12-13 DIAGNOSIS — H524 Presbyopia: Secondary | ICD-10-CM | POA: Diagnosis not present

## 2017-12-13 DIAGNOSIS — H5213 Myopia, bilateral: Secondary | ICD-10-CM | POA: Diagnosis not present

## 2017-12-13 DIAGNOSIS — H52221 Regular astigmatism, right eye: Secondary | ICD-10-CM | POA: Diagnosis not present

## 2017-12-20 ENCOUNTER — Encounter: Payer: Self-pay | Admitting: Gastroenterology

## 2017-12-20 ENCOUNTER — Telehealth: Payer: Self-pay | Admitting: *Deleted

## 2017-12-20 NOTE — Telephone Encounter (Signed)
Copied from [place] [id number]. Topic: Referral - Request >> Dec 20, 2017  2:56 PM Scherrie Gerlach wrote: Reason for CRM: pt states she keeps getting a lot of UTI and would like a referral to a urologist.  Also requesting a referral for a 3 d mammogram. Pt states she was advised last one she needed due to having dense breast. Pt is new to the area.

## 2017-12-20 NOTE — Telephone Encounter (Signed)
Patient is scheduled and aware.

## 2017-12-20 NOTE — Telephone Encounter (Signed)
I am happy to see her as a new patient.  I recall writing this on a packet of her medical records recently.  I am not sure where those records went.  Can we have records sent again from her previous gastroenterologist.  Arrange for her to see me at my next available new GI appointment.  If she needs Remicade prior to then she should get it at [facility] but after she is seen here I am happy to take over for all of her GI needs.

## 2017-12-20 NOTE — Telephone Encounter (Signed)
Dr. Ardis Hughs will you accept?

## 2017-12-26 ENCOUNTER — Other Ambulatory Visit: Payer: Self-pay | Admitting: Family Medicine

## 2017-12-26 NOTE — Telephone Encounter (Signed)
Pt is requests for the referrals to urologist and 3D mammogram, please advise if ok to place

## 2017-12-28 NOTE — Telephone Encounter (Signed)
ok 

## 2017-12-30 ENCOUNTER — Other Ambulatory Visit: Payer: Self-pay | Admitting: Family Medicine

## 2017-12-30 DIAGNOSIS — N3001 Acute cystitis with hematuria: Secondary | ICD-10-CM

## 2017-12-30 DIAGNOSIS — R922 Inconclusive mammogram: Secondary | ICD-10-CM

## 2017-12-30 NOTE — Telephone Encounter (Signed)
Referrals have been placed, pt is aware to wait for a phone call from the referral office.

## 2018-01-13 NOTE — Progress Notes (Signed)
Chief Complaint  Patient presents with  . Urinary Tract Infection    Recurrent UTI since April 2019. Pt is sexually active and is not sure if the recurrent UTIs could be from intercourse. Pt requesting a form of BC, pt does smoke daily. Pt haivng some urinary discomfort/pain at end of urination and does not feel as though she is emptying her bladder 100%     HPI: Amanda Klein 42 y.o. come in for sda PCP NA   Number of concerns issues   Hs cystitis sx in July   uc mult sp and  Neg ua  Findings  RX  and got bttr  And now about 2 weeks of sx off and on . No fever  Serious flank pain or chills no vag sx   recently new partner .  interetested in  ocps hx of same in past   Still uses tobacco but working on quitting   Remote hx of  Urethral scar tissue and had procedure . ? Hx of stone?  Urology referral pending  ? hasnt heard yet .  Can I refill her ranitidine that she uses as needed and not daily ? Setting up with new Gi  On remicade of Crohns  in  [place].  Travels back and forth from [place]: See pertinent positives and negatives per HPI. No fever chills hmeaturia sig abd pain   No past medical history on file.  No family history on file.  Social History   Socioeconomic History  . Marital status: Single    Spouse name: Not on file  . Number of children: Not on file  . Years of education: Not on file  . Highest education level: Not on file  Occupational History  . Not on file  Social Needs  . Financial resource strain: Not on file  . Food insecurity:    Worry: Not on file    Inability: Not on file  . Transportation needs:    Medical: Not on file    Non-medical: Not on file  Tobacco Use  . Smoking status: Former Research scientist (life sciences)  . Smokeless tobacco: Former Network engineer and Sexual Activity  . Alcohol use: Yes    Comment: OCCASSIONAL  . Drug use: Yes    Types: Marijuana    Comment: OCCASSIONAL  . Sexual activity: Not Currently  Lifestyle  . Physical  activity:    Days per week: Not on file    Minutes per session: Not on file  . Stress: Not on file  Relationships  . Social connections:    Talks on phone: Not on file    Gets together: Not on file    Attends religious service: Not on file    Active member of club or organization: Not on file    Attends meetings of clubs or organizations: Not on file    Relationship status: Not on file  Other Topics Concern  . Not on file  Social History Narrative  . Not on file    Outpatient Medications Prior to Visit  Medication Sig Dispense Refill  . fluticasone (FLONASE) 50 MCG/ACT nasal spray Place 1 spray into both nostrils daily.    Marland Kitchen inFLIXimab (REMICADE) 100 MG injection Inject into the vein every 8 (eight) weeks.    . ranitidine (ZANTAC) 150 MG tablet Take 150 mg by mouth 2 (two) times daily.     No facility-administered medications prior to visit.      EXAM:  BP 106/72 (  BP Location: Right Arm, Patient Position: Sitting, Cuff Size: Normal)   Pulse 78   Temp 97.6 F (36.4 C) (Oral)   Wt 130 lb 9.6 oz (59.2 kg)   BMI 24.68 kg/m   Body mass index is 24.68 kg/m.  GENERAL: vitals reviewed and listed above, alert, oriented, appears well hydrated and in no acute distress HEENT: atraumatic, conjunctiva  clear, no obvious abnormalities on inspection of external nose and ears NECK: no obvious masses on inspection palpation  LUNGS: clear to auscultation bilaterally, no wheezes, rales or rhonchi, good air movement CV: HRRR, no clubbing cyanosis or  peripheral edema nl cap refill  Abdomen:  Sof,t normal bowel sounds without hepatosplenomegaly, no guarding rebound or masses no CVA tenderness  MS: moves all extremities without noticeable focal  abnormality PSYCH: pleasant and cooperative, no obvious depression or anxiety Lab Results  Component Value Date   WBC 4.9 11/04/2017   HGB 12.3 11/04/2017   HCT 37.5 11/04/2017   PLT 285.0 11/04/2017   GLUCOSE 95 11/04/2017   CHOL 147  11/04/2017   TRIG 60.0 11/04/2017   HDL 46.10 11/04/2017   LDLCALC 89 11/04/2017   NA 138 11/04/2017   K 4.1 11/04/2017   CL 105 11/04/2017   CREATININE 1.17 11/04/2017   BUN 8 11/04/2017   CO2 26 11/04/2017   TSH 1.15 11/04/2017   HGBA1C 5.7 11/04/2017   BP Readings from Last 3 Encounters:  01/17/18 106/72  12/08/17 120/60  11/04/17 98/70  u apos leuk 3+ 1 + blood send for cx   ASSESSMENT AND PLAN:  Discussed the following assessment and plan:  Pain with urination - Plan: POC Urinalysis Dipstick, Urine Culture, Urine Culture  Feeling of incomplete bladder emptying - Plan: POC Urinalysis Dipstick, Urine Culture, Urine Culture  Encounter for other general counseling or advice on contraception  History of recurrent UTIs  Crohn's disease without complication, unspecified gastrointestinal tract location ([place])  History of urethral narrowing  Medication management  Tobacco use Hx recurrance dysuria  By review    And  Hx of ? meatal stenosis?   rx in past   No obv  obst sx at present  Disc combined ocps and contraindicated for risk cause of age and tobacco   Disc other methods including iud  Progesterone only . etc  .  Advise she talk with dr Volanda Napoleon about this    Will  Look into referral  Uro. Refill ranitidine per requested until pcp or gi can do this  ( later pharmacy said not covered by her insurance  Told can use otcx)   Total visit 54mns > 50% spent counseling and coordinating care as indicated in above note and in instructions to patient .  -Patient advised to return or notify health care team  if  new concerns arise.  Patient Instructions  Treat for infection pending culture . urology referral has been placed  And wil have staff look into  This  They should contact you for appt  Time  Combines ocps   More risking in your r age group and tobacco use.   consideration of  Progesterone only or  iud or explanon . May wasn't to discuss with dr BVolanda Napoleon  I will get her  this information.          WStandley Brooking Panosh M.D.

## 2018-01-17 ENCOUNTER — Ambulatory Visit: Payer: BLUE CROSS/BLUE SHIELD | Admitting: Internal Medicine

## 2018-01-17 ENCOUNTER — Encounter: Payer: Self-pay | Admitting: Internal Medicine

## 2018-01-17 ENCOUNTER — Telehealth: Payer: Self-pay | Admitting: Internal Medicine

## 2018-01-17 VITALS — BP 106/72 | HR 78 | Temp 97.6°F | Wt 130.6 lb

## 2018-01-17 DIAGNOSIS — R309 Painful micturition, unspecified: Secondary | ICD-10-CM

## 2018-01-17 DIAGNOSIS — Z87440 Personal history of urinary (tract) infections: Secondary | ICD-10-CM | POA: Insufficient documentation

## 2018-01-17 DIAGNOSIS — Z79899 Other long term (current) drug therapy: Secondary | ICD-10-CM

## 2018-01-17 DIAGNOSIS — R3914 Feeling of incomplete bladder emptying: Secondary | ICD-10-CM | POA: Diagnosis not present

## 2018-01-17 DIAGNOSIS — Z3009 Encounter for other general counseling and advice on contraception: Secondary | ICD-10-CM

## 2018-01-17 DIAGNOSIS — Z720 Tobacco use: Secondary | ICD-10-CM

## 2018-01-17 DIAGNOSIS — K5090 Crohn's disease, unspecified, without complications: Secondary | ICD-10-CM | POA: Insufficient documentation

## 2018-01-17 DIAGNOSIS — F17210 Nicotine dependence, cigarettes, uncomplicated: Secondary | ICD-10-CM | POA: Insufficient documentation

## 2018-01-17 DIAGNOSIS — Z87448 Personal history of other diseases of urinary system: Secondary | ICD-10-CM | POA: Insufficient documentation

## 2018-01-17 LAB — POCT URINALYSIS DIPSTICK
Bilirubin, UA: NEGATIVE
GLUCOSE UA: NEGATIVE
Nitrite, UA: NEGATIVE
Protein, UA: POSITIVE — AB
Spec Grav, UA: 1.015 (ref 1.010–1.025)
Urobilinogen, UA: 0.2 E.U./dL
pH, UA: 6 (ref 5.0–8.0)

## 2018-01-17 MED ORDER — RANITIDINE HCL 150 MG PO TABS: 150.0000 mg | ORAL_TABLET | Freq: Two times a day (BID) | ORAL | 0 refills | Status: DC

## 2018-01-17 MED ORDER — NITROFURANTOIN MONOHYD MACRO 100 MG PO CAPS: 100.0000 mg | ORAL_CAPSULE | Freq: Two times a day (BID) | ORAL | 0 refills | Status: AC

## 2018-01-17 NOTE — Telephone Encounter (Addendum)
Pt seen today - Zantac not covered by insurance Pt is needing something else called in  Please advise Dr Regis Bill, thanks.

## 2018-01-17 NOTE — Telephone Encounter (Signed)
Called [facility] and was advised that the insurance will not cover the Ranitidine.  Pt can take OTC or try a possible alternative like Prilosec or Nexium.   Per Dr Regis Bill, take the OTC equivalent until can get further recommendations from Dr Volanda Napoleon on an alternative.   Will send to Dr Volanda Napoleon to advise.

## 2018-01-17 NOTE — Patient Instructions (Addendum)
Treat for infection pending culture . urology referral has been placed  And wil have staff look into  This  They should contact you for appt  Time  Combines ocps   More risking in your r age group and tobacco use.   consideration of  Progesterone only or  iud or explanon . May wasn't to discuss with dr Volanda Napoleon.  I will get her this information.

## 2018-01-18 LAB — URINE CULTURE
MICRO NUMBER:: 91049281
SPECIMEN QUALITY:: ADEQUATE

## 2018-01-18 NOTE — Telephone Encounter (Signed)
Ok with OTC prilosec or nexium

## 2018-01-19 ENCOUNTER — Other Ambulatory Visit: Payer: Self-pay | Admitting: Internal Medicine

## 2018-01-19 MED ORDER — AMOXICILLIN 500 MG PO CAPS: 500.0000 mg | ORAL_CAPSULE | Freq: Three times a day (TID) | ORAL | 0 refills | Status: DC

## 2018-01-19 NOTE — Telephone Encounter (Signed)
Called pt left a message for pt with instructions

## 2018-01-20 NOTE — Telephone Encounter (Signed)
Spoke with pt voiced understanding that she should stop taking the macrobid and start the new Antibiotic Amoxicillin 500 mg, pt voiced understanding

## 2018-01-20 NOTE — Telephone Encounter (Signed)
Pt would like to know if she needs to take both abx or just the new one, contact to advise

## 2018-01-25 DIAGNOSIS — K50919 Crohn's disease, unspecified, with unspecified complications: Secondary | ICD-10-CM | POA: Diagnosis not present

## 2018-02-01 ENCOUNTER — Ambulatory Visit: Payer: BLUE CROSS/BLUE SHIELD | Admitting: Family Medicine

## 2018-02-02 ENCOUNTER — Encounter: Payer: Self-pay | Admitting: Family Medicine

## 2018-02-02 ENCOUNTER — Ambulatory Visit: Payer: BLUE CROSS/BLUE SHIELD | Admitting: Family Medicine

## 2018-02-02 VITALS — BP 118/86 | HR 74 | Temp 97.8°F | Wt 126.0 lb

## 2018-02-02 DIAGNOSIS — F17210 Nicotine dependence, cigarettes, uncomplicated: Secondary | ICD-10-CM | POA: Diagnosis not present

## 2018-02-02 DIAGNOSIS — R339 Retention of urine, unspecified: Secondary | ICD-10-CM

## 2018-02-02 DIAGNOSIS — Z3009 Encounter for other general counseling and advice on contraception: Secondary | ICD-10-CM | POA: Diagnosis not present

## 2018-02-02 LAB — POC URINALSYSI DIPSTICK (AUTOMATED)
Bilirubin, UA: NEGATIVE
Glucose, UA: NEGATIVE
Ketones, UA: NEGATIVE
NITRITE UA: NEGATIVE
PH UA: 6 (ref 5.0–8.0)
Protein, UA: NEGATIVE
SPEC GRAV UA: 1.015 (ref 1.010–1.025)
Urobilinogen, UA: 0.2 E.U./dL

## 2018-02-02 MED ORDER — FAMOTIDINE 20 MG PO TABS: 20.0000 mg | ORAL_TABLET | Freq: Every day | ORAL | 0 refills | Status: DC

## 2018-02-02 MED ORDER — FLUTICASONE PROPIONATE 50 MCG/ACT NA SUSP: 1.0000 | Freq: Every day | NASAL | 11 refills | Status: AC

## 2018-02-02 MED ORDER — NORETHINDRONE 0.35 MG PO TABS: 1.0000 | ORAL_TABLET | Freq: Every day | ORAL | 11 refills | Status: DC

## 2018-02-02 NOTE — Progress Notes (Signed)
Subjective:    Patient ID: Amanda Klein, female    DOB: [birth year], 42 y.o.   MRN: [id number]  No chief complaint on file.   HPI Patient was seen today for f/u.  Pt notes still having the feeling of incomplete voiding.  Notes slight burning at the end of urination.  Pt denies frequency, discharge, fever, chills, back pain.  Restarted sexually active after being abstinent for a few years.  Pt also requesting birth control.  Pt notes her menses started today.  Pt still smoking cigarettes. Denies h/o migraines.  History reviewed. No pertinent past medical history.  No Known Allergies  ROS General: Denies fever, chills, night sweats, changes in weight, changes in appetite HEENT: Denies headaches, ear pain, changes in vision, rhinorrhea, sore throat CV: Denies CP, palpitations, SOB, orthopnea Pulm: Denies SOB, cough, wheezing GI: Denies abdominal pain, nausea, vomiting, diarrhea, constipation GU: Denies dysuria, hematuria, frequency, vaginal discharge  +incomplete bladder emptying. Msk: Denies muscle cramps, joint pains Neuro: Denies weakness, numbness, tingling Skin: Denies rashes, bruising Psych: Denies depression, anxiety, hallucinations     Objective:    Blood pressure 118/86, pulse 74, temperature 97.8 F (36.6 C), temperature source Oral, weight 126 lb (57.2 kg), SpO2 98 %.   Gen. Pleasant, well-nourished, in no distress, normal affect   Lungs: no accessory muscle use, CTAB, no wheezes or rales Cardiovascular: RRR, n no peripheral edema Neuro:  A&Ox3, CN II-XII intact, normal gait Skin:  Warm, no lesions/ rash   Wt Readings from Last 3 Encounters:  02/02/18 126 lb (57.2 kg)  01/17/18 130 lb 9.6 oz (59.2 kg)  12/08/17 129 lb (58.5 kg)    Lab Results  Component Value Date   WBC 4.9 11/04/2017   HGB 12.3 11/04/2017   HCT 37.5 11/04/2017   PLT 285.0 11/04/2017   GLUCOSE 95 11/04/2017   CHOL 147 11/04/2017   TRIG 60.0 11/04/2017   HDL 46.10 11/04/2017   LDLCALC  89 11/04/2017   NA 138 11/04/2017   K 4.1 11/04/2017   CL 105 11/04/2017   CREATININE 1.17 11/04/2017   BUN 8 11/04/2017   CO2 26 11/04/2017   TSH 1.15 11/04/2017   HGBA1C 5.7 11/04/2017    Assessment/Plan:  Incomplete bladder emptying  -UA with 3+ leuks, 3+ RBCs, SG 1.015 - Plan: Urine Culture -will call pt with results and send in rx if needed.  Birth control counseling  -discussed combined OCPs and other birth control contraindicated give pt's age and smoking hx. -pt does not want an IUD. -will try progesterone only OCPs.  Discussed importance of taking medication consistently at the same time every day.  Also discussed need to quit smoking.. - Plan: norethindrone (MICRONOR,Amanda Klein,Amanda Klein) 0.35 MG tablet  Nicotine dependence -smoking cessation counseling >3 min, <10 min -discussed cutting down.  Pt willing to work on this -continue to address at each visit  F/u prn  Grier Mitts, MD

## 2018-02-04 LAB — URINE CULTURE
MICRO NUMBER: 91126385
SPECIMEN QUALITY: ADEQUATE

## 2018-02-05 ENCOUNTER — Other Ambulatory Visit: Payer: Self-pay | Admitting: Family Medicine

## 2018-02-05 DIAGNOSIS — N3000 Acute cystitis without hematuria: Secondary | ICD-10-CM

## 2018-02-05 MED ORDER — NITROFURANTOIN MONOHYD MACRO 100 MG PO CAPS: 100.0000 mg | ORAL_CAPSULE | Freq: Two times a day (BID) | ORAL | 0 refills | Status: AC

## 2018-02-06 ENCOUNTER — Encounter: Payer: Self-pay | Admitting: Family Medicine

## 2018-02-06 DIAGNOSIS — N390 Urinary tract infection, site not specified: Secondary | ICD-10-CM | POA: Diagnosis not present

## 2018-02-06 DIAGNOSIS — N3000 Acute cystitis without hematuria: Secondary | ICD-10-CM | POA: Diagnosis not present

## 2018-02-06 DIAGNOSIS — N200 Calculus of kidney: Secondary | ICD-10-CM | POA: Diagnosis not present

## 2018-02-06 DIAGNOSIS — B952 Enterococcus as the cause of diseases classified elsewhere: Secondary | ICD-10-CM | POA: Diagnosis not present

## 2018-02-06 DIAGNOSIS — N135 Crossing vessel and stricture of ureter without hydronephrosis: Secondary | ICD-10-CM | POA: Diagnosis not present

## 2018-02-15 ENCOUNTER — Telehealth: Payer: Self-pay

## 2018-02-15 ENCOUNTER — Encounter: Payer: Self-pay | Admitting: Gastroenterology

## 2018-02-15 ENCOUNTER — Ambulatory Visit: Payer: BLUE CROSS/BLUE SHIELD | Admitting: Gastroenterology

## 2018-02-15 VITALS — BP 100/70 | HR 76 | Ht 62.0 in | Wt 131.0 lb

## 2018-02-15 DIAGNOSIS — K50119 Crohn's disease of large intestine with unspecified complications: Secondary | ICD-10-CM | POA: Diagnosis not present

## 2018-02-15 NOTE — Telephone Encounter (Signed)
-----   Message from Angie Fava, LPN sent at 23/09/5730 10:44 AM EDT ----- Regarding: Remicade Amanda Klein, this patient needs to get set up with Rheumatology for remacade 63m/kg(120mg) infusions. She will need to start the first week in Nonember then every 8 weeks. Thanks, [employer]

## 2018-02-15 NOTE — Telephone Encounter (Signed)
Referral made for Amanda Klein to authorize.  The records were sent to [facility].

## 2018-02-15 NOTE — Patient Instructions (Addendum)
You will have labs checked today in the basement lab.  Please head down after you check out with the front desk  (TB quant gold).  We need your first colonoscopies (your first 1-2). We already have the 2015 report.  remicade 40m/kg dose (1233m, every 8 weeks.   Please return to see Dr. JaArdis Hughsn 3 months. Sooner if any problems.  One of your biggest health concerns is your smoking.  This increases your risk for most cancers and serious cardiovascular diseases such as strokes, heart attacks.  You should try your best to stop.  If you need assistance, please contact your PCP or Smoking Cessation Class at [phone] [facility]1-800-QUIT-NOW).  Smoking also makes it much harder to manage Crohn's disease.   Your provider has requested that you go to the basement level for lab work before leaving today. Press "B" on the elevator. The lab is located at the first door on the left as you exit the elevator.

## 2018-02-15 NOTE — Progress Notes (Signed)
HPI: This is a very pleasant 42 year old woman who was referred to me by Amanda Ruddy, MD  to evaluate Crohn's disease.    Chief complaint is Crohn's disease  Possibly duodenum Crohn's.  Had vomiting, diarrhea, abd pains.  Overall stable weight.    Moved to [place] in April 2019, lost some weight around the move.    She's been on prednisone, mesalamine, 21m when she was first diagnosed.  In the past 6-7 years, she's been on remicade every 8 weeks.  She flared with off remicade briefly between providers.  Her last Remicade dose was in early September  Old Data Reviewed:  I reviewed a packet of information from [employer]  The most recent office note from gastroenterology Dr. tJeneen Klein from September 2018.  His note discusses her Crohn's colitis.  It sounds like she was diagnosed around 2012.  At that time she was receiving Remicade infusions every 8 weeks.  It looks like her most recent colonoscopy was done 2005 while she lived in [place]  This was by a different gastroenterologist.  Colonoscopy report from December 2015 which was done for "follow-up of Crohn's disease".  Mentions that she had "severe Crohn's colitis in early 2014.  Findings on her December 2015 colonoscopy showed "multiple scars in the transverse colon, splenic flexure, descending colon, and sigmoid colon.  Terminal ileum was not intubated.  Biopsies were taken from her colon I do not have those reports.  Previous blood work, lab testing shows PPD -November 2016, hepatitis a and B immunizations were performed in 2012.  Pneumovax 2012 as well.   Review of systems: Pertinent positive and negative review of systems were noted in the above HPI section. All other review negative.   Past Medical History:  Diagnosis Date  . Kidney stones     Past Surgical History:  Procedure Laterality Date  . CARPAL TUNNEL RELEASE Right   . KNOT EXCISION Left    forehead above eye, age 55  . URETHRA  SURGERY      Current Outpatient Medications  Medication Sig Dispense Refill  . famotidine (PEPCID) 20 MG tablet Take 1 tablet (20 mg total) by mouth daily. (Patient taking differently: Take 20 mg by mouth as needed. ) 30 tablet 0  . fluticasone (FLONASE) 50 MCG/ACT nasal spray Place 1 spray into both nostrils daily. 11.1 g 11  . inFLIXimab (REMICADE) 100 MG injection Inject into the vein every 8 (eight) weeks.    . norethindrone (MICRONOR,CAMILA,ERRIN) 0.35 MG tablet Take 1 tablet (0.35 mg total) by mouth daily. 1 Package 11   No current facility-administered medications for this visit.     Allergies as of 02/15/2018  . (No Known Allergies)    Family History  Problem Relation Age of Onset  . Breast cancer Mother   . Heart disease Father   . Breast cancer Paternal Grandmother   . Hyperlipidemia Brother     Social History   Socioeconomic History  . Marital status: Single    Spouse name: Not on file  . Number of children: 0  . Years of education: Not on file  . Highest education level: Not on file  Occupational History  . Not on file  Social Needs  . Financial resource strain: Not on file  . Food insecurity:    Worry: Not on file    Inability: Not on file  . Transportation needs:    Medical: Not on file    Non-medical: Not on file  Tobacco Use  . Smoking status: Current Every Day Smoker  . Smokeless tobacco: Never Used  Substance and Sexual Activity  . Alcohol use: Yes    Comment: wine on special occasions  . Drug use: Yes    Types: Marijuana    Comment: OCCASSIONAL  . Sexual activity: Not Currently  Lifestyle  . Physical activity:    Days per week: Not on file    Minutes per session: Not on file  . Stress: Not on file  Relationships  . Social connections:    Talks on phone: Not on file    Gets together: Not on file    Attends religious service: Not on file    Active member of club or organization: Not on file    Attends meetings of clubs or organizations:  Not on file    Relationship status: Not on file  . Intimate partner violence:    Fear of current or ex partner: Not on file    Emotionally abused: Not on file    Physically abused: Not on file    Forced sexual activity: Not on file  Other Topics Concern  . Not on file  Social History Narrative  . Not on file     Physical Exam: BP 100/70 (BP Location: Left Arm, Patient Position: Sitting, Cuff Size: Normal)   Pulse 76   Ht 5' 2"  (1.575 m) Comment: height measured without shoes  Wt 131 lb (59.4 kg)   LMP 02/02/2018   BMI 23.96 kg/m  Constitutional: generally well-appearing Psychiatric: alert and oriented x3 Eyes: extraocular movements intact Mouth: oral pharynx moist, no lesions Neck: supple no lymphadenopathy Cardiovascular: heart regular rate and rhythm Lungs: clear to auscultation bilaterally Abdomen: soft, nontender, nondistended, no obvious ascites, no peritoneal signs, normal bowel sounds Extremities: no lower extremity edema bilaterally Skin: no lesions on visible extremities   Assessment and plan: 42 y.o. female with Crohn's disease  She was originally diagnosed around 2012.  She thinks she has small bowel Crohn's as well as Crohn's in her colon.  She will get records from her previous, earliest colonoscopies to try to firm that up.  She also thinks she may have had Crohn's in her duodenum.  Either way her symptoms have been very well controlled on Remicade 5 mg/kg every 8 weeks for at least 6 to 7 years.  I am happy to take over her care.  Her next every 8-week Remicade dose would be in early November and we will work with the rheumatology office here to arrange that.  She needs annual TB testing and we will send blood test for that.  She is up-to-date on her flu vaccination this year.  She will return to see me in 3 to 4 months and sooner if any issues.  She asked if she is "due for colonoscopy" and we discussed this quite a bit she understands that she is not due for any  type of invasive testing at this point.  Possibly in 2022 which would be 10 years after her original diagnosis of what has been described as severe colitis I might start screening for colon cancer.   Please see the "Patient Instructions" section for addition details about the plan.   Owens Loffler, MD [facility] 02/15/2018, 10:21 AM  Cc: Amanda Ruddy, MD

## 2018-02-17 ENCOUNTER — Telehealth: Payer: Self-pay | Admitting: Gastroenterology

## 2018-02-17 NOTE — Telephone Encounter (Signed)
   Reviewed outside records from [facility] gastroenterology and endoscopy center.  Colonoscopy February 2019 for nausea, vomiting, diarrhea, blood in stools, weight loss.  EGD was normal except for mild erythema and erosions in the gastric body.  These were biopsied.  Pathology of the duodenum biopsies were normal, biopsies of the stomach showed some mild chronic gastritis negative for H. pylori.  Colonoscopy found a deep ulcer at the interior of the dentate line by the anus, 3 to 4 mm long.  The rectum was otherwise normal.  "Erythema with innumerable deep large serpentine ulcers from the sigmoid through the transverse colon few aphthous ulcers in the ascending and cecum.  Few aphthous ulcers in the terminal ileum just inside the IC valve with normal-appearing mucus deeper in with intubation to approximately 10 cm".  Pathology results showed mild active active inflammation in the terminal ileum and right colon.  Severe active inflammation in the transverse.  Focal active inflammation in the rectum.  Negative for granulomas throughout however the pathologist commented that the features were suggestive of inflammatory bowel disease of Crohn's type.

## 2018-02-21 DIAGNOSIS — K5090 Crohn's disease, unspecified, without complications: Secondary | ICD-10-CM | POA: Diagnosis not present

## 2018-02-21 DIAGNOSIS — Z79899 Other long term (current) drug therapy: Secondary | ICD-10-CM | POA: Diagnosis not present

## 2018-02-21 DIAGNOSIS — R5383 Other fatigue: Secondary | ICD-10-CM | POA: Diagnosis not present

## 2018-02-26 ENCOUNTER — Other Ambulatory Visit: Payer: Self-pay | Admitting: Family Medicine

## 2018-03-18 ENCOUNTER — Other Ambulatory Visit: Payer: Self-pay | Admitting: Family Medicine

## 2018-03-29 DIAGNOSIS — K5090 Crohn's disease, unspecified, without complications: Secondary | ICD-10-CM | POA: Diagnosis not present

## 2018-05-31 DIAGNOSIS — K5090 Crohn's disease, unspecified, without complications: Secondary | ICD-10-CM | POA: Diagnosis not present

## 2018-06-05 ENCOUNTER — Telehealth: Payer: Self-pay

## 2018-06-05 NOTE — Telephone Encounter (Signed)
Ok to send Rx for protonix pt was on Famotidine but insurance doesn't cover it.

## 2018-06-05 NOTE — Telephone Encounter (Signed)
That's fine

## 2018-06-05 NOTE — Telephone Encounter (Signed)
Copied from [place] [id number]. Topic: General - Other >> Jun 05, 2018  2:55 PM Carolyn Stare wrote:  Pt said her insurance will cover PANTOPRAZOLE and she is asking for a RX to be sent in    [place]

## 2018-06-06 ENCOUNTER — Other Ambulatory Visit: Payer: Self-pay

## 2018-06-06 MED ORDER — PANTOPRAZOLE SODIUM 40 MG PO TBEC: 40.0000 mg | DELAYED_RELEASE_TABLET | Freq: Every day | ORAL | 0 refills | Status: DC

## 2018-06-06 NOTE — Telephone Encounter (Signed)
Rx for Protonix 40 mg was sent to pt pharmacy as requested

## 2018-06-29 ENCOUNTER — Other Ambulatory Visit: Payer: Self-pay | Admitting: Family Medicine

## 2018-07-12 ENCOUNTER — Ambulatory Visit: Payer: BLUE CROSS/BLUE SHIELD | Admitting: Family Medicine

## 2018-07-12 ENCOUNTER — Encounter: Payer: Self-pay | Admitting: Family Medicine

## 2018-07-12 ENCOUNTER — Other Ambulatory Visit: Payer: Self-pay

## 2018-07-12 VITALS — BP 128/84 | HR 66 | Temp 98.6°F | Wt 137.4 lb

## 2018-07-12 DIAGNOSIS — F17210 Nicotine dependence, cigarettes, uncomplicated: Secondary | ICD-10-CM

## 2018-07-12 DIAGNOSIS — Z131 Encounter for screening for diabetes mellitus: Secondary | ICD-10-CM

## 2018-07-12 DIAGNOSIS — L309 Dermatitis, unspecified: Secondary | ICD-10-CM | POA: Diagnosis not present

## 2018-07-12 DIAGNOSIS — R202 Paresthesia of skin: Secondary | ICD-10-CM

## 2018-07-12 DIAGNOSIS — J302 Other seasonal allergic rhinitis: Secondary | ICD-10-CM

## 2018-07-12 DIAGNOSIS — R200 Anesthesia of skin: Secondary | ICD-10-CM

## 2018-07-12 LAB — POCT GLYCOSYLATED HEMOGLOBIN (HGB A1C): HEMOGLOBIN A1C: 5 % (ref 4.0–5.6)

## 2018-07-12 MED ORDER — HYDROCORTISONE 1 % EX CREA: 1.0000 "application " | TOPICAL_CREAM | Freq: Two times a day (BID) | CUTANEOUS | 0 refills | Status: DC

## 2018-07-12 MED ORDER — CETIRIZINE HCL 10 MG PO TABS: 10.0000 mg | ORAL_TABLET | Freq: Every day | ORAL | 11 refills | Status: DC

## 2018-07-12 NOTE — Patient Instructions (Signed)
Steps to Quit Smoking  Smoking tobacco can be bad for your health. It can also affect almost every organ in your body. Smoking puts you and people around you at risk for many serious long-lasting (chronic) diseases. Quitting smoking is hard, but it is one of the best things that you can do for your health. It is never too late to quit. What are the benefits of quitting smoking? When you quit smoking, you lower your risk for getting serious diseases and conditions. They can include:  Lung cancer or lung disease.  Heart disease.  Stroke.  Heart attack.  Not being able to have children (infertility).  Weak bones (osteoporosis) and broken bones (fractures). If you have coughing, wheezing, and shortness of breath, those symptoms may get better when you quit. You may also get sick less often. If you are pregnant, quitting smoking can help to lower your chances of having a baby of low birth weight. What can I do to help me quit smoking? Talk with your doctor about what can help you quit smoking. Some things you can do (strategies) include:  Quitting smoking totally, instead of slowly cutting back how much you smoke over a period of time.  Going to in-person counseling. You are more likely to quit if you go to many counseling sessions.  Using resources and support systems, such as: ? Database administrator with a Social worker. ? Phone quitlines. ? Careers information officer. ? Support groups or group counseling. ? Text messaging programs. ? Mobile phone apps or applications.  Taking medicines. Some of these medicines may have nicotine in them. If you are pregnant or breastfeeding, do not take any medicines to quit smoking unless your doctor says it is okay. Talk with your doctor about counseling or other things that can help you. Talk with your doctor about using more than one strategy at the same time, such as taking medicines while you are also going to in-person counseling. This can help make  quitting easier. What things can I do to make it easier to quit? Quitting smoking might feel very hard at first, but there is a lot that you can do to make it easier. Take these steps:  Talk to your family and friends. Ask them to support and encourage you.  Call phone quitlines, reach out to support groups, or work with a Social worker.  Ask people who smoke to not smoke around you.  Avoid places that make you want (trigger) to smoke, such as: ? Bars. ? Parties. ? Smoke-break areas at work.  Spend time with people who do not smoke.  Lower the stress in your life. Stress can make you want to smoke. Try these things to help your stress: ? Getting regular exercise. ? Deep-breathing exercises. ? Yoga. ? Meditating. ? Doing a body scan. To do this, close your eyes, focus on one area of your body at a time from head to toe, and notice which parts of your body are tense. Try to relax the muscles in those areas.  Download or buy apps on your mobile phone or tablet that can help you stick to your quit plan. There are many free apps, such as QuitGuide from the [employer] Office manager for Disease Control and Prevention). You can find more support from smokefree.gov and other websites. This information is not intended to replace advice given to you by your health care provider. Make sure you discuss any questions you have with your health care provider. Document Released: 02/27/2009 Document Revised: 12/30/2015  Document Reviewed: 09/17/2014 Elsevier Interactive Patient Education  2019 Elsevier Inc.  Paresthesia Paresthesia is a burning or prickling feeling. This feeling can happen in any part of the body. It often happens in the hands, arms, legs, or feet. Usually, it is not painful. In most cases, the feeling goes away in a short time and is not a sign of a serious problem. If you have paresthesia that lasts a long time, you may need to be seen by your doctor. Follow these instructions at home: Alcohol  use   Do not drink alcohol if: ? Your doctor tells you not to drink. ? You are pregnant, may be pregnant, or are planning to become pregnant.  If you drink alcohol, limit how much you have: ? 0-1 drink a day for women. ? 0-2 drinks a day for men.  Be aware of how much alcohol is in your drink. In the [place] one drink equals one typical bottle of beer (12 oz), one-half glass of wine (5 oz), or one shot of hard liquor (1 oz). Nutrition  Eat a healthy diet. This includes: ? Eating foods that have a lot of fiber in them, such as fresh fruits and vegetables, whole grains, and beans. ? Limiting foods that have a lot of fat and processed sugars in them, such as fried or sweet foods. General instructions  Take over-the-counter and prescription medicines only as told by your doctor.  Do not use any products that have nicotine or tobacco in them, such as cigarettes and e-cigarettes. If you need help quitting, ask your doctor.  If you have diabetes, work with your doctor to make sure your blood sugar stays in a healthy range.  If your feet feel numb: ? Check for redness, warmth, and swelling every day. ? Wear padded socks and comfortable shoes. These help protect your feet.  Keep all follow-up visits as told by your doctor. This is important. Contact a doctor if:  You have paresthesia that gets worse or does not go away.  Your burning or prickling feeling gets worse when you walk.  You have pain or cramps.  You feel dizzy.  You have a rash. Get help right away if you:  Feel weak.  Have trouble walking or moving.  Have problems speaking, understanding, or seeing.  Feel confused.  Cannot control when you pee (urinate) or poop (have a bowel movement).  Lose feeling (have numbness) after an injury.  Have new weakness in an arm or leg.  Pass out (faint). Summary  Paresthesia is a burning or prickling feeling. It often happens in the hands, arms, legs, or feet.  In most  cases, the feeling goes away in a short time and is not a sign of a serious problem.  If you have paresthesia that lasts a long time, you may need to be seen by your doctor. This information is not intended to replace advice given to you by your health care provider. Make sure you discuss any questions you have with your health care provider. Document Released: 04/15/2008 Document Revised: 05/12/2017 Document Reviewed: 05/12/2017 Elsevier Interactive Patient Education  2019 Elsevier Inc.  Rash, Adult  A rash is a change in the color of your skin. A rash can also change the way your skin feels. There are many different conditions and factors that can cause a rash. Follow these instructions at home: The goal of treatment is to stop the itching and keep the rash from spreading. Watch for any changes in your  symptoms. Let your doctor know about them. Follow these instructions to help with your condition: Medicine Take or apply over-the-counter and prescription medicines only as told by your doctor. These may include medicines:  To treat red or swollen skin (corticosteroid creams).  To treat itching.  To treat an allergy (oral antihistamines).  To treat very bad symptoms (oral corticosteroids).  Skin care  Put cool cloths (compresses) on the affected areas.  Do not scratch or rub your skin.  Avoid covering the rash. Make sure that the rash is exposed to air as much as possible. Managing itching and discomfort  Avoid hot showers or baths. These can make itching worse. A cold shower may help.  Try taking a bath with: ? Epsom salts. You can get these at your local pharmacy or grocery store. Follow the instructions on the package. ? Baking soda. Pour a small amount into the bath as told by your doctor. ? Colloidal oatmeal. You can get this at your local pharmacy or grocery store. Follow the instructions on the package.  Try putting baking soda paste onto your skin. Stir water into  baking soda until it gets like a paste.  Try putting on a lotion that relieves itchiness (calamine lotion).  Keep cool and out of the sun. Sweating and being hot can make itching worse. General instructions   Rest as needed.  Drink enough fluid to keep your pee (urine) pale yellow.  Wear loose-fitting clothing.  Avoid scented soaps, detergents, and perfumes. Use gentle soaps, detergents, perfumes, and other cosmetic products.  Avoid anything that causes your rash. Keep a journal to help track what causes your rash. Write down: ? What you eat. ? What cosmetic products you use. ? What you drink. ? What you wear. This includes jewelry.  Keep all follow-up visits as told by your doctor. This is important. Contact a doctor if:  You sweat at night.  You lose weight.  You pee (urinate) more than normal.  You pee less than normal, or you notice that your pee is a darker color than normal.  You feel weak.  You throw up (vomit).  Your skin or the whites of your eyes look yellow (jaundice).  Your skin: ? Tingles. ? Is numb.  Your rash: ? Does not go away after a few days. ? Gets worse.  You are: ? More thirsty than normal. ? More tired than normal.  You have: ? New symptoms. ? Pain in your belly (abdomen). ? A fever. ? Watery poop (diarrhea). Get help right away if:  You have a fever and your symptoms suddenly get worse.  You start to feel mixed up (confused).  You have a very bad headache or a stiff neck.  You have very bad joint pains or stiffness.  You have jerky movements that you cannot control (seizure).  Your rash covers all or most of your body. The rash may or may not be painful.  You have blisters that: ? Are on top of the rash. ? Grow larger. ? Grow together. ? Are painful. ? Are inside your nose or mouth.  You have a rash that: ? Looks like purple pinprick-sized spots all over your body. ? Has a "bull's eye" or looks like a target. ? Is  red and painful, causes your skin to peel, and is not from being in the sun too long. Summary  A rash is a change in the color of your skin. A rash can also change the way  your skin feels.  The goal of treatment is to stop the itching and keep the rash from spreading.  Take or apply over-the-counter and prescription medicines only as told by your doctor.  Contact a doctor if you have new symptoms or symptoms that get worse.  Keep all follow-up visits as told by your doctor. This is important. This information is not intended to replace advice given to you by your health care provider. Make sure you discuss any questions you have with your health care provider. Document Released: 10/20/2007 Document Revised: 12/05/2017 Document Reviewed: 12/05/2017 Elsevier Interactive Patient Education  2019 [employer].

## 2018-07-12 NOTE — Progress Notes (Signed)
Subjective:    Patient ID: Amanda Klein, female    DOB: [birth year], 43 y.o.   MRN: [id number]  Chief Complaint  Patient presents with  . Numbness    Both hands and feet accompanied by dark urine   . Rash    From patches     HPI Patient was seen today for ongoing concerns.    Rash: -skin irritation on b/l upper arms/shoulders at previous site of nicotine patch -present x a few wks -at times pruritic -has not tried anything for the areas  Nicotine use: -Patient quit smoking then started again a few weeks ago. -Trying to quit smoking again.  May smoke a few cigarettes per day, less than 1/2 pk -Using nicotine patches -Also using 1 800 quit now number.  States they send free information and patches as well as an motivational text throughout the day.  Seasonal allergies: -Pt request allergy pill -Was using Flonase, but states having increased sinus drainage -Interested in trying Zyrtec  Concerns for diabetes: -Pt states several family members have a history of diabetes -Pt denies increased hunger or thirst.  Pt endorses increased urination, but has been drinking more water as recommended by urology for urethral narrowning and recurrent UTIs -Pt also notes numbness and tingling in hands and feet x 1 month   Past Medical History:  Diagnosis Date  . Kidney stones     No Known Allergies  ROS General: Denies fever, chills, night sweats, changes in weight, changes in appetite HEENT: Denies headaches, ear pain, changes in vision, rhinorrhea, sore throat CV: Denies CP, palpitations, SOB, orthopnea Pulm: Denies SOB, cough, wheezing GI: Denies abdominal pain, nausea, vomiting, diarrhea, constipation GU: Denies dysuria, hematuria, frequency, vaginal discharge Msk: Denies muscle cramps, joint pains Neuro: Denies weakness  + numbness, tingling Skin: Denies rashes, bruising  +rash Psych: Denies depression, anxiety, hallucinations    Objective:    Blood pressure 128/84, pulse  66, temperature 98.6 F (37 C), temperature source Oral, weight 137 lb 6.4 oz (62.3 kg), SpO2 98 %.  Gen. Pleasant, well-nourished, in no distress, normal affect  HEENT: [place]/AT, face symmetric, no scleral icterus, PERRLA, nares patent without drainage Lungs: no accessory muscle use Cardiovascular: RRR, no peripheral edema Abdomen: BS present, soft, NT/ND. Musculoskeletal: No deformities, no cyanosis or clubbing, normal tone Neuro:  A&Ox3, CN II-XII intact, normal gait Skin:  Warm, warm, dry, intact, no lesions.  B/l shoulders with erythema in the shape of the nicotine patch.  Nicotine patch in place on L upper arm.  Wt Readings from Last 3 Encounters:  07/12/18 137 lb 6.4 oz (62.3 kg)  02/15/18 131 lb (59.4 kg)  02/02/18 126 lb (57.2 kg)    Lab Results  Component Value Date   WBC 4.9 11/04/2017   HGB 12.3 11/04/2017   HCT 37.5 11/04/2017   PLT 285.0 11/04/2017   GLUCOSE 95 11/04/2017   CHOL 147 11/04/2017   TRIG 60.0 11/04/2017   HDL 46.10 11/04/2017   LDLCALC 89 11/04/2017   NA 138 11/04/2017   K 4.1 11/04/2017   CL 105 11/04/2017   CREATININE 1.17 11/04/2017   BUN 8 11/04/2017   CO2 26 11/04/2017   TSH 1.15 11/04/2017   HGBA1C 5.7 11/04/2017    Assessment/Plan:  Dermatitis  -Skin irritation likely 2/2 nicotine patch. -Discussed various options to aid patient in quitting smoking.  Patient desires to continue nicotine patch at this time -Can use hydrocortisone cream if needed - Plan: hydrocortisone cream 1 %  Screening  for diabetes mellitus  - Plan: POC HgB A1c 5.0%  Numbness and tingling -Discussed various causes -Given handout -Consider daily multivitamin -Hemoglobin A1c 5.0%  Seasonal allergies  - Plan: cetirizine (ZYRTEC) 10 MG tablet  Cigarette nicotine dependence without complication -Smoking cessation counseling greater than 3 minutes, less than 10 minutes -Continue using patch and working to quit smoking -Continue using resources available through  1 800 quit now number -Given handout -Will readdress at each visit  F/u prn  Grier Mitts, MD

## 2018-07-24 ENCOUNTER — Ambulatory Visit: Payer: Self-pay

## 2018-07-24 NOTE — Telephone Encounter (Signed)
Incoming call from Patient who request a Rx for Macrobid for UTI.  Patient reports the following Sx; Urine is cloudy and there is pain when voiding. Denies fever.  .  Denies flank pain, vaginal discharge or blood in the urine. Patient states her Kidney Dr.  Prescribed Macrobid for to take after sex that is was the Patient has been taking.  Patient request Rx [facility] into [facility].     Answer Assessment - Initial Assessment Questions 1. ANTIBIOTIC: "What antibiotic are you taking?" "How many times per day?"     *No Answer* 2. DURATION: "When was the antibiotic started?"     *No Answer* 3. MAIN SYMPTOM: "What is the main symptom you are concerned about?"     *No Answer*u9rine id cloudy hurts when voiding 4. FEVER: "Do you have a fever?" If so, ask: "What is it, how was it measured, and when did it start?"     *No Answer*denies5. OTHER SYMPTOMS: "Do you have any other symptoms?" (e.g., flank pain, vaginal discharge, blood in urine)      Denies.  Protocols used: URINARY TRACT INFECTION ON ANTIBIOTIC FOLLOW-UP CALL [facility]

## 2018-07-24 NOTE — Telephone Encounter (Signed)
Patient has a scheduled appointment  For Monday 07/31/18 with Grier Mitts.  Doesn't want lapse in Medication.

## 2018-07-25 NOTE — Telephone Encounter (Signed)
FYI

## 2018-07-26 DIAGNOSIS — K5090 Crohn's disease, unspecified, without complications: Secondary | ICD-10-CM | POA: Diagnosis not present

## 2018-07-31 ENCOUNTER — Ambulatory Visit: Payer: BLUE CROSS/BLUE SHIELD | Admitting: Family Medicine

## 2018-08-03 ENCOUNTER — Other Ambulatory Visit: Payer: Self-pay | Admitting: Family Medicine

## 2018-08-28 ENCOUNTER — Other Ambulatory Visit: Payer: Self-pay | Admitting: Family Medicine

## 2018-09-08 ENCOUNTER — Encounter: Payer: Self-pay | Admitting: Gastroenterology

## 2018-09-20 DIAGNOSIS — K5090 Crohn's disease, unspecified, without complications: Secondary | ICD-10-CM | POA: Diagnosis not present

## 2018-09-22 ENCOUNTER — Other Ambulatory Visit: Payer: Self-pay | Admitting: Family Medicine

## 2018-09-25 ENCOUNTER — Ambulatory Visit: Payer: Self-pay | Admitting: *Deleted

## 2018-09-25 NOTE — Telephone Encounter (Signed)
Patient is having SOB with exertion. Patient has noticed a change for about I- 1/2 months. Patient has also noticed a jump in her BP at her last OV- she does not have a way to check this at home. Patient is calling because she wants a referral to cardiology. Appointment made for evaluation of her symptoms.  Reason for Disposition . [1] MILD difficulty breathing (e.g., minimal/no SOB at rest, SOB with walking, pulse <100) AND [2] NEW-onset or WORSE than normal  Answer Assessment - Initial Assessment Questions 1. RESPIRATORY STATUS: "Describe your breathing?" (e.g., wheezing, shortness of breath, unable to speak, severe coughing)      SOB with exertion only 2. ONSET: "When did this breathing problem begin?"      45 days ago- patient is smoker 3. PATTERN "Does the difficult breathing come and go, or has it been constant since it started?"      Comes and goes- only with stairs or walks a long way 4. SEVERITY: "How bad is your breathing?" (e.g., mild, moderate, severe)    - MILD: No SOB at rest, mild SOB with walking, speaks normally in sentences, can lay down, no retractions, pulse < 100.    - MODERATE: SOB at rest, SOB with minimal exertion and prefers to sit, cannot lie down flat, speaks in phrases, mild retractions, audible wheezing, pulse 100-120.    - SEVERE: Very SOB at rest, speaks in single words, struggling to breathe, sitting hunched forward, retractions, pulse > 120      Mild/moderate 5. RECURRENT SYMPTOM: "Have you had difficulty breathing before?" If so, ask: "When was the last time?" and "What happened that time?"      no 6. CARDIAC HISTORY: "Do you have any history of heart disease?" (e.g., heart attack, angina, bypass surgery, angioplasty)      No- family history of heart disease 7. LUNG HISTORY: "Do you have any history of lung disease?"  (e.g., pulmonary embolus, asthma, emphysema)     no 8. CAUSE: "What do you think is causing the breathing problem?"      heart 9. OTHER  SYMPTOMS: "Do you have any other symptoms? (e.g., dizziness, runny nose, cough, chest pain, fever)     Allergies are acting up- runny nose, no temperature 10. PREGNANCY: "Is there any chance you are pregnant?" "When was your last menstrual period?"       No- LMP- cycle ending now 11. TRAVEL: "Have you traveled out of the country in the last month?" (e.g., travel history, exposures)       No travel- no exposure  Protocols used: BREATHING DIFFICULTY-A-AH

## 2018-09-25 NOTE — Telephone Encounter (Signed)
Called pt left a message to call the office back.

## 2018-09-27 ENCOUNTER — Other Ambulatory Visit: Payer: Self-pay

## 2018-09-27 ENCOUNTER — Encounter: Payer: Self-pay | Admitting: Family Medicine

## 2018-09-27 ENCOUNTER — Ambulatory Visit (INDEPENDENT_AMBULATORY_CARE_PROVIDER_SITE_OTHER): Payer: BLUE CROSS/BLUE SHIELD | Admitting: Family Medicine

## 2018-09-27 DIAGNOSIS — J029 Acute pharyngitis, unspecified: Secondary | ICD-10-CM

## 2018-09-27 DIAGNOSIS — R0602 Shortness of breath: Secondary | ICD-10-CM

## 2018-09-27 DIAGNOSIS — J302 Other seasonal allergic rhinitis: Secondary | ICD-10-CM

## 2018-09-27 DIAGNOSIS — F17210 Nicotine dependence, cigarettes, uncomplicated: Secondary | ICD-10-CM

## 2018-09-27 NOTE — Progress Notes (Signed)
Virtual Visit via Video Note  I connected with Amanda Crochet. Klein on 09/27/18 at  8:00 AM EDT by a video enabled telemedicine application and verified that I am speaking with the correct person using two identifiers.  Location patient: home Location provider:work or home office Persons participating in the virtual visit: patient, provider  I discussed the limitations of evaluation and management by telemedicine and the availability of in person appointments. The patient expressed understanding and agreed to proceed.   HPI: Pt requesting a cardiology referral.  States having SOB 3/4 of the way up a flight of stairs.  Happening x 1.5 mos.  States also hard to breath at work 2/2 wearing a face mask and shield.  Able to lay flat at night.  Has some coughing, but relates it to smoking 1/2 ppd.  Pt set a quit date of May 18th.  Plans to use the patch.  Quit in the past, but restarted.  States symptoms are not bothersome, but she figured she would mention them.  Denies CP, LE edema.  Pt states she has been told she snores at night.    Pt states bp was 132/97 at remicade infusion visit.  She states she was nervous and just smoked a cigarette prior to infusion.  Pt's bp did go down to 120/78.  Pt notes mild anxiety about COVID-19 pandemic.  Pt endorses allergy issues.  Having rhinorrhea and L sided sore throat on L side that started yesterday.  Started Zyrtec.  Denies fever, HA, ear pain or pressure.  ROS: See pertinent positives and negatives per HPI. Checking temp at home and at work. Past Medical History:  Diagnosis Date  . Kidney stones     Past Surgical History:  Procedure Laterality Date  . CARPAL TUNNEL RELEASE Right   . KNOT EXCISION Left    forehead above eye, age 69  . URETHRA SURGERY      Family History  Problem Relation Age of Onset  . Breast cancer Mother   . Heart disease Father   . Breast cancer Paternal Grandmother   . Hyperlipidemia Brother     SOCIAL HX:   Current  Outpatient Medications:  .  cetirizine (ZYRTEC) 10 MG tablet, Take 1 tablet (10 mg total) by mouth daily., Disp: 30 tablet, Rfl: 11 .  fluticasone (FLONASE) 50 MCG/ACT nasal spray, Place 1 spray into both nostrils daily., Disp: 11.1 g, Rfl: 11 .  hydrocortisone cream 1 %, Apply 1 application topically 2 (two) times daily., Disp: 30 g, Rfl: 0 .  inFLIXimab (REMICADE) 100 MG injection, Inject into the vein every 8 (eight) weeks., Disp: , Rfl:  .  norethindrone (MICRONOR,CAMILA,ERRIN) 0.35 MG tablet, Take 1 tablet (0.35 mg total) by mouth daily. (Patient not taking: Reported on 07/12/2018), Disp: 1 Package, Rfl: 11 .  pantoprazole (PROTONIX) 40 MG tablet, TAKE 1 TABLET BY MOUTH EVERY DAY, Disp: 30 tablet, Rfl: 1  EXAM:  VITALS per patient if applicable:  Afebrile, RR between 12-20 bpm  GENERAL: alert, oriented, appears well and in no acute distress  HEENT: atraumatic, conjunctiva clear, no obvious abnormalities on inspection of external nose and ears  NECK: normal movements of the head and neck  LUNGS: on inspection no signs of respiratory distress, breathing rate appears normal, no obvious gross SOB, gasping or wheezing  CV: no obvious cyanosis  MS: moves all visible extremities without noticeable abnormality  PSYCH/NEURO: pleasant and cooperative, no obvious depression or anxiety, speech and thought processing grossly intact  ASSESSMENT AND  PLAN:  Discussed the following assessment and plan:  SOB (shortness of breath) on exertion  -discussed possible causes including pulmonary vs cardiac, deconditioning, anemia.  More likely related to pulmonary given h/o tobacco use. -will refer to Pulm.  Consider PFTs and sleep study. - Plan: Ambulatory referral to Pulmonology  Sore throat -likely related to allergies vs viral etiology -discussed supportive care including gargling with warm salt water or chloraseptic spray. -notify clinic if symptoms continue.   Consider B12 as half of throat is  sore.  Seasonal allergies -continue zyrtec prn  Cigarette nicotine dependence without complication -smoking cessation counseling >3 min, <10 min -smoking 1/2 ppd -quit date set May 18th.  Will use patch. -1-800-QUITNOW info -readdress at each [facility]  Pt encouraged to continue monitoring bp.  F/u prn for continued or worsening symptoms  I discussed the assessment and treatment plan with the patient. The patient was provided an opportunity to ask questions and all were answered. The patient agreed with the plan and demonstrated an understanding of the instructions.   The patient was advised to call back or seek an in-person evaluation if the symptoms worsen or if the condition fails to improve as anticipated.   Billie Ruddy, MD

## 2018-09-28 NOTE — Telephone Encounter (Signed)
Pt was seen yesterday by Dr Volanda Napoleon

## 2018-10-28 ENCOUNTER — Other Ambulatory Visit: Payer: Self-pay | Admitting: Family Medicine

## 2018-11-07 ENCOUNTER — Telehealth: Payer: Self-pay

## 2018-11-07 NOTE — Telephone Encounter (Signed)
Covid-19 screening questions   Do you now or have you had a fever in the last 14 days no  Do you have any respiratory symptoms of shortness of breath or cough now or in the last 14 days no  Do you have any family members or close contacts with diagnosed or suspected Covid-19 in the past 14 days no  Have you been tested for Covid-19 and found to be positive no

## 2018-11-08 ENCOUNTER — Other Ambulatory Visit (INDEPENDENT_AMBULATORY_CARE_PROVIDER_SITE_OTHER): Payer: BC Managed Care – PPO

## 2018-11-08 ENCOUNTER — Encounter: Payer: Self-pay | Admitting: Gastroenterology

## 2018-11-08 ENCOUNTER — Ambulatory Visit (INDEPENDENT_AMBULATORY_CARE_PROVIDER_SITE_OTHER): Payer: BC Managed Care – PPO | Admitting: Gastroenterology

## 2018-11-08 VITALS — BP 100/60 | HR 67 | Temp 97.9°F | Ht 62.5 in | Wt 137.0 lb

## 2018-11-08 DIAGNOSIS — K50119 Crohn's disease of large intestine with unspecified complications: Secondary | ICD-10-CM

## 2018-11-08 LAB — CBC WITH DIFFERENTIAL/PLATELET
Basophils Absolute: 0 10*3/uL (ref 0.0–0.1)
Basophils Relative: 0.5 % (ref 0.0–3.0)
Eosinophils Absolute: 0 10*3/uL (ref 0.0–0.7)
Eosinophils Relative: 0.4 % (ref 0.0–5.0)
HCT: 40.7 % (ref 36.0–46.0)
Hemoglobin: 13.6 g/dL (ref 12.0–15.0)
Lymphocytes Relative: 22.5 % (ref 12.0–46.0)
Lymphs Abs: 1.5 10*3/uL (ref 0.7–4.0)
MCHC: 33.4 g/dL (ref 30.0–36.0)
MCV: 91 fl (ref 78.0–100.0)
Monocytes Absolute: 0.6 10*3/uL (ref 0.1–1.0)
Monocytes Relative: 9.8 % (ref 3.0–12.0)
Neutro Abs: 4.3 10*3/uL (ref 1.4–7.7)
Neutrophils Relative %: 66.8 % (ref 43.0–77.0)
Platelets: 258 10*3/uL (ref 150.0–400.0)
RBC: 4.47 Mil/uL (ref 3.87–5.11)
RDW: 13.8 % (ref 11.5–15.5)
WBC: 6.5 10*3/uL (ref 4.0–10.5)

## 2018-11-08 LAB — COMPREHENSIVE METABOLIC PANEL
ALT: 9 U/L (ref 0–35)
AST: 18 U/L (ref 0–37)
Albumin: 4.2 g/dL (ref 3.5–5.2)
Alkaline Phosphatase: 38 U/L — ABNORMAL LOW (ref 39–117)
BUN: 8 mg/dL (ref 6–23)
CO2: 25 mEq/L (ref 19–32)
Calcium: 8.7 mg/dL (ref 8.4–10.5)
Chloride: 105 mEq/L (ref 96–112)
Creatinine, Ser: 0.87 mg/dL (ref 0.40–1.20)
GFR: 71.09 mL/min (ref >60.00)
Glucose, Bld: 99 mg/dL (ref 70–99)
Potassium: 3.8 mEq/L (ref 3.5–5.1)
Sodium: 138 mEq/L (ref 135–145)
Total Bilirubin: 0.5 mg/dL (ref 0.2–1.2)
Total Protein: 7.6 g/dL (ref 6.0–8.3)

## 2018-11-08 LAB — HIGH SENSITIVITY CRP: CRP, High Sensitivity: 0.29 mg/L (ref 0.000–5.000)

## 2018-11-08 LAB — SEDIMENTATION RATE: Sed Rate: 12 mm/hr (ref 0–20)

## 2018-11-08 NOTE — Progress Notes (Signed)
Review of pertinent gastrointestinal problems: 1. Crohn's disease; diagnosed 2012 elsewhere.  Establish care with me October 2019.  Colonoscopy February 2014 [place] found deep ulcer at the [facility].  Also "erythema and innumerable deep large serpentine ulcers from the sigmoid through the transverse colon.  Few aphthous ulcers in the a sending and cecum.  Few aphthous ulcers in the terminal ileum just inside the IC valve.  Path showed active inflammation in the terminal ileum and right colon, severe active inflammation of the transverse, focal active inflammation in the rectum.  Negative for granulomas throughout however the pathologist commented that the features were suggestive of IBD, Crohn's disease EGD February 2014 [facility] normal except for mild erythema and erosions in the gastric body. Colonoscopy December 2015 done for "follow-up of Crohn's disease".showed "multiple scars in the transverse colon, splenic flexure, descending colon, and sigmoid colon. Terminal ileum was not intubated.     HPI: This is a very pleasant 43 year old woman whom I last saw about 8 months ago.  She has been getting Remicade 5 mg/kg every other month.  She tells me that the logistics work out quite smoothly with the rheumatology facility.  She has no troubles with her bowels except for about a week prior to her infusion she noticed slight softening of her stools usually.  This has been the pattern for 5 or 6 years at least.  Still solid however.  Nonbloody.  No abdominal pains.  Chief complaint is Crohn's ileocolitis  Her weight is up 6 pounds since the last visit here 8 months ago, same scale.  ROS: complete GI ROS as described in HPI, all other review negative.  Constitutional:  No unintentional weight loss   Past Medical History:  Diagnosis Date  . Kidney stones     Past Surgical History:  Procedure Laterality Date  . CARPAL TUNNEL RELEASE Right   . KNOT EXCISION  Left    forehead above eye, age 29  . URETHRA SURGERY      Current Outpatient Medications  Medication Sig Dispense Refill  . cetirizine (ZYRTEC) 10 MG tablet Take 1 tablet (10 mg total) by mouth daily. 30 tablet 11  . fluticasone (FLONASE) 50 MCG/ACT nasal spray Place 1 spray into both nostrils daily. 11.1 g 11  . inFLIXimab (REMICADE) 100 MG injection Inject into the vein every 8 (eight) weeks.    . pantoprazole (PROTONIX) 40 MG tablet TAKE 1 TABLET BY MOUTH EVERY DAY 30 tablet 1   No current facility-administered medications for this visit.     Allergies as of 11/08/2018  . (No Known Allergies)    Family History  Problem Relation Age of Onset  . Breast cancer Mother   . Heart disease Father   . Breast cancer Paternal Grandmother   . Hyperlipidemia Brother     Social History   Socioeconomic History  . Marital status: Single    Spouse name: Not on file  . Number of children: 0  . Years of education: Not on file  . Highest education level: Not on file  Occupational History  . Not on file  Social Needs  . Financial resource strain: Not on file  . Food insecurity    Worry: Not on file    Inability: Not on file  . Transportation needs    Medical: Not on file    Non-medical: Not on file  Tobacco Use  . Smoking status: Former Smoker    Quit date: 05/17/2018  Years since quitting: 0.4  . Smokeless tobacco: Never Used  Substance and Sexual Activity  . Alcohol use: Yes    Comment: wine on special occasions  . Drug use: Yes    Types: Marijuana    Comment: OCCASSIONAL  . Sexual activity: Not Currently  Lifestyle  . Physical activity    Days per week: Not on file    Minutes per session: Not on file  . Stress: Not on file  Relationships  . Social Herbalist on phone: Not on file    Gets together: Not on file    Attends religious service: Not on file    Active member of club or organization: Not on file    Attends meetings of clubs or organizations: Not  on file    Relationship status: Not on file  . Intimate partner violence    Fear of current or ex partner: Not on file    Emotionally abused: Not on file    Physically abused: Not on file    Forced sexual activity: Not on file  Other Topics Concern  . Not on file  Social History Narrative  . Not on file     Physical Exam: BP 100/60   Pulse 67   Temp 97.9 F (36.6 C)   Ht 5' 2.5" (1.588 m)   Wt 137 lb (62.1 kg)   BMI 24.66 kg/m  Constitutional: generally well-appearing Psychiatric: alert and oriented x3 Abdomen: soft, nontender, nondistended, no obvious ascites, no peritoneal signs, normal bowel sounds No peripheral edema noted in lower extremities  Assessment and plan: 43 y.o. female with Crohn's ileocolitis  She is doing very well clinically.  She will continue Remicade 5 mg/kg every other week.  Labs today including a CBC, complete metabolic profile, inflammatory markers.  As long as nothing concerning about the labs return office visit in 12 months and sooner if she has any issues.  Please see the "Patient Instructions" section for addition details about the plan.  Amanda Loffler, MD [facility] 11/08/2018, 9:38 AM

## 2018-11-08 NOTE — Patient Instructions (Addendum)
Blood tests today, TB quant gold, CBC, complete metabolic profile, sedimentation rate and CRP.   You have been given a separate informational sheet regarding your tobacco use, the importance of quitting and local resources to help you quit.  Thank you for entrusting me with your care and choosing [facility].  Dr Ardis Hughs

## 2018-11-10 LAB — QUANTIFERON-TB GOLD PLUS
Mitogen-NIL: 9.71 IU/mL
NIL: 0.02 IU/mL
QuantiFERON-TB Gold Plus: NEGATIVE
TB1-NIL: 0.01 IU/mL
TB2-NIL: 0.01 IU/mL

## 2018-11-13 ENCOUNTER — Other Ambulatory Visit: Payer: Self-pay | Admitting: Family Medicine

## 2018-11-21 DIAGNOSIS — K5090 Crohn's disease, unspecified, without complications: Secondary | ICD-10-CM | POA: Diagnosis not present

## 2018-12-08 ENCOUNTER — Other Ambulatory Visit: Payer: Self-pay

## 2018-12-08 ENCOUNTER — Encounter: Payer: Self-pay | Admitting: Internal Medicine

## 2018-12-08 ENCOUNTER — Ambulatory Visit: Payer: BC Managed Care – PPO | Admitting: Internal Medicine

## 2018-12-08 ENCOUNTER — Ambulatory Visit (INDEPENDENT_AMBULATORY_CARE_PROVIDER_SITE_OTHER): Payer: BC Managed Care – PPO

## 2018-12-08 DIAGNOSIS — J449 Chronic obstructive pulmonary disease, unspecified: Secondary | ICD-10-CM

## 2018-12-08 DIAGNOSIS — R05 Cough: Secondary | ICD-10-CM | POA: Diagnosis not present

## 2018-12-08 DIAGNOSIS — F17210 Nicotine dependence, cigarettes, uncomplicated: Secondary | ICD-10-CM

## 2018-12-08 MED ORDER — BUDESONIDE-FORMOTEROL FUMARATE 80-4.5 MCG/ACT IN AERO: 2.0000 | INHALATION_SPRAY | Freq: Two times a day (BID) | RESPIRATORY_TRACT | 0 refills | Status: DC

## 2018-12-08 MED ORDER — BUDESONIDE-FORMOTEROL FUMARATE 80-4.5 MCG/ACT IN AERO: 2.0000 | INHALATION_SPRAY | Freq: Two times a day (BID) | RESPIRATORY_TRACT | 11 refills | Status: DC

## 2018-12-08 NOTE — Progress Notes (Signed)
Amanda Klein, female    DOB: 03/30/1976, 43 y.o.   MRN: [id number]   Brief patient profile:  22 yowf active smoker healthy child/ young adult with new onset 2017  nasal congestion /watery nose/HA  while living in [place] w/in a year of arrival from [place] > rx by PCP with flonase/zyrtec seemed to help then chronic cough 2018  then moved to [place] 2019 and noted doe x  stairs one flight and stops at top and referred to pulmonary clinic 12/08/2018 by Dr Volanda Napoleon for sob - says quit smoking for a month in Jan 2020 and no change in symptoms.      History of Present Illness  12/08/2018  Pulmonary/ 1st office eval/Nareg Breighner  Chief Complaint  Patient presents with  . Pulmonary Consult    Referred by Dr. Grier Mitts for eval of SOB. Pt c/o DOE with walking up stairs for the past year.   Dyspnea:  MMRC1 = can walk nl pace, flat grade, can't hurry or go up hills or steps s sob   Cough: worse p smoking / doesn't note in am or hs /min mucoid  Sleep: no trouble on 2-3 pillows  SABA use: none Prednisone "made her craz"y Actively smoking taking ppi prn with sometimes gagging/  vomiting    No obvious day to day or daytime variability or assoc excess/ purulent sputum or mucus plugs or hemoptysis or cp or chest tightness, subjective wheeze or overt sinus symptoms.   Sleeping most nights  without nocturnal  or early am exacerbation  of respiratory  c/o's or need for noct saba. Also denies any obvious fluctuation of symptoms with weather or environmental changes or other aggravating or alleviating factors except as outlined above   No unusual exposure hx or h/o childhood pna/ asthma or knowledge of premature birth.  Current Allergies, Complete Past Medical History, Past Surgical History, Family History, and Social History were reviewed in [employer] record.  ROS  The following are not active complaints unless bolded Hoarseness, sore throat, dysphagia, dental problems, itching,  sneezing,  nasal congestion or discharge of excess mucus or purulent secretions, ear ache,   fever, chills, sweats, unintended wt loss or wt gain, classically pleuritic or exertional cp,  orthopnea pnd or arm/hand swelling  or leg swelling, presyncope, palpitations, abdominal pain, anorexia, nausea, vomiting, diarrhea  or change in bowel habits or change in bladder habits, change in stools or change in urine, dysuria, hematuria,  rash, arthralgias, visual complaints, headache, numbness, weakness or ataxia or problems with walking or coordination,  change in mood or  memory.              Past Medical History:  Diagnosis Date  . Kidney stones     Outpatient Medications Prior to Visit  Medication Sig Dispense Refill  . cetirizine (ZYRTEC) 10 MG tablet Take 1 tablet (10 mg total) by mouth daily. 30 tablet 11  . fluticasone (FLONASE) 50 MCG/ACT nasal spray Place 1 spray into both nostrils daily. 11.1 g 11  . inFLIXimab (REMICADE) 100 MG injection Inject into the vein every 8 (eight) weeks.    . pantoprazole (PROTONIX) 40 MG tablet TAKE 1 TABLET BY MOUTH EVERY DAY 90 tablet 1         Objective:     BP 108/64 (BP Location: Left Arm, Cuff Size: Normal)   Pulse 89   Temp (!) 97.5 F (36.4 C) (Oral)   Ht 5' 2"  (1.575 m)   Wt 137  lb 3.2 oz (62.2 kg)   SpO2 100% Comment: on RA  BMI 25.09 kg/m   SpO2: 100 %(on RA)   amb wf nad   . HEENT: nl dentition / oropharynx. Nl external ear canals without cough reflex -  Mild bilateral non-specific turbinate edema     NECK :  without JVD/Nodes/TM/ nl carotid upstrokes bilaterally   LUNGS: no acc muscle use,  Min barrel  contour chest wall with bilateral  slightly decreased bs s audible wheeze and  without cough on insp or exp maneuver and min  Hyperresonant  to  percussion bilaterally     CV:  RRR  no s3 or murmur or increase in P2, and no edema   ABD:  soft and nontender with pos end  insp Hoover's  in the supine position. No bruits or  organomegaly appreciated, bowel sounds nl  MS:   Nl gait/  ext warm without deformities, calf tenderness, cyanosis or clubbing No obvious joint restrictions   SKIN: warm and dry without lesions    NEURO:  alert, approp, nl sensorium with  no motor or cerebellar deficits apparent.       CXR PA and Lateral:   12/08/2018 :    I personally reviewed images and agree with radiology impression as follows:    Hyperinflated lungs without acute infiltrate.   Lab Results  Component Value Date   WBC 6.5 11/08/2018   HGB 13.6 11/08/2018   HCT 40.7 11/08/2018   MCV 91.0 11/08/2018   PLT 258.0 11/08/2018       EOS                                                              0.0                                     11/08/2018     Assessment   Asthmatic bronchitis , chronic (HCC) Active smoker  - 12/08/2018  After extensive coaching inhaler device,  effectiveness =    75% > try symb 80 2bid   DDX of  difficult airways management almost all start with A and  include Adherence, Ace Inhibitors, Acid Reflux, Active Sinus Disease, Alpha 1 Antitripsin deficiency, Anxiety masquerading as Airways dz,  ABPA,  Allergy(esp in young), Aspiration (esp in elderly), Adverse effects of meds,  Active smoking or vaping, A bunch of PE's (a small clot burden can't cause this syndrome unless there is already severe underlying pulm or vascular dz with poor reserve) plus two Bs  = Bronchiectasis and Beta blocker use..and one C= CHF   Adherence is always the initial "prime suspect" and is a multilayered concern that requires a "trust but verify" approach in every patient - starting with knowing how to use medications, especially inhalers, correctly, keeping up with refills and understanding the fundamental difference between maintenance and prns vs those medications only taken for a very short course and then stopped and not refilled.  - see hfa teaching - return with all meds in hand using a trust but verify approach to  confirm accurate Medication  Reconciliation The principal here is that until we are certain that the  patients are doing what we've  asked, it makes no sense to ask them to do more.   Active smoking (see separate a/p)   ? Acid (or non-acid) GERD > always difficult to exclude as up to 75% of pts in some series report no assoc GI/ Heartburn symptoms> rec max  diet restrictions/ reviewed and instructions given in writing >>>  if not better start ppi bid x 4 week trial    ? Alpha one AT > if spirometry abn needs screening   ? Adverse drug effects > none of the usual suspects listed   ? Allergy > note 0 eos on cbc, pattern more c/w cb than  >>> She says pfts done in [place]  last year were nl so probably doesn't have significant copd yet but certainly is as risk as long as she continues to smoke (see separate a/p)   For now rec trial of low dose ics as she has a fear of prednisone and work on optimal hfa/ minimal cigs x 6 weeks and return for f/u   Cigarette smoker Counseled re importance of smoking cessation but did not meet time criteria for separate billing         Total time devoted to counseling  > 50 % of initial 60 min office visit:  review case with pt/ performed device teaching which extended face to face time for this visit / discussion of options/alternatives/ personally creating written customized instructions  in presence of pt  then going over those specific  Instructions directly with the pt including how to use all of the meds but in particular covering each new medication in detail and the difference between the maintenance= "automatic" meds and the prns using an action plan format for the latter (If this problem/symptom => do that organization reading Left to right).  Please see AVS from this visit for a full list of these instructions which I personally wrote for this pt and  are unique to this visit.   Christinia Gully, MD 12/08/2018

## 2018-12-08 NOTE — Assessment & Plan Note (Addendum)
Active smoker  - 12/08/2018  After extensive coaching inhaler device,  effectiveness =    75% > try symb 80 2bid   DDX of  difficult airways management almost all start with A and  include Adherence, Ace Inhibitors, Acid Reflux, Active Sinus Disease, Alpha 1 Antitripsin deficiency, Anxiety masquerading as Airways dz,  ABPA,  Allergy(esp in young), Aspiration (esp in elderly), Adverse effects of meds,  Active smoking or vaping, A bunch of PE's (a small clot burden can't cause this syndrome unless there is already severe underlying pulm or vascular dz with poor reserve) plus two Bs  = Bronchiectasis and Beta blocker use..and one C= CHF   Adherence is always the initial "prime suspect" and is a multilayered concern that requires a "trust but verify" approach in every patient - starting with knowing how to use medications, especially inhalers, correctly, keeping up with refills and understanding the fundamental difference between maintenance and prns vs those medications only taken for a very short course and then stopped and not refilled.  - see hfa teaching - return with all meds in hand using a trust but verify approach to confirm accurate Medication  Reconciliation The principal here is that until we are certain that the  patients are doing what we've asked, it makes no sense to ask them to do more.   Active smoking (see separate a/p)   ? Acid (or non-acid) GERD > always difficult to exclude as up to 75% of pts in some series report no assoc GI/ Heartburn symptoms> rec max  diet restrictions/ reviewed and instructions given in writing >>>  if not better start ppi bid x 4 week trial    ? Alpha one AT > if spirometry abn needs screening   ? Adverse drug effects > none of the usual suspects listed   ? Allergy > note 0 eos on cbc, pattern more c/w cb than  >>> She says pfts done in [place]  last year were nl so probably doesn't have significant copd yet but certainly is as risk as long as she continues  to smoke (see separate a/p)   For now rec trial of low dose ics as she has a fear of prednisone and work on optimal hfa/ minimal cigs x 6 weeks and return for f/u

## 2018-12-08 NOTE — Patient Instructions (Addendum)
Symbicort 80 Take 2 puffs first thing in am and then another 2 puffs about 12 hours later.    GERD (REFLUX)  is an extremely common cause of respiratory symptoms just like yours , many times with no obvious heartburn at all.    It can be treated with medication, but also with lifestyle changes including elevation of the head of your bed (ideally with 6 -8inch blocks under the headboard of your bed),  Smoking cessation, avoidance of late meals, excessive alcohol, and avoid fatty foods, chocolate, peppermint, colas, red wine, and acidic juices such as orange juice.  NO MINT OR MENTHOL PRODUCTS SO NO COUGH DROPS  USE SUGARLESS CANDY INSTEAD (Jolley ranchers or Stover's or Life Savers) or even ice chips will also do - the key is to swallow to prevent all throat clearing. NO OIL BASED VITAMINS - use powdered substitutes.  Avoid fish oil when coughing.   The key is to stop smoking completely before smoking completely stops you!  Please remember to go to the  x-ray department  for your tests - we will call you with the results when they are available    Please schedule a follow up office visit in 6 weeks, call sooner if needed  - prior pfts req, needs spirometry before and after on return.

## 2018-12-09 ENCOUNTER — Encounter: Payer: Self-pay | Admitting: Internal Medicine

## 2018-12-09 NOTE — Assessment & Plan Note (Signed)
Counseled re importance of smoking cessation but did not meet time criteria for separate billing     Total time devoted to counseling  > 50 % of initial 60 min office visit:  review case with pt/ performed device teaching which extended face to face time for this visit / discussion of options/alternatives/ personally creating written customized instructions  in presence of pt  then going over those specific  Instructions directly with the pt including how to use all of the meds but in particular covering each new medication in detail and the difference between the maintenance= "automatic" meds and the prns using an action plan format for the latter (If this problem/symptom => do that organization reading Left to right).  Please see AVS from this visit for a full list of these instructions which I personally wrote for this pt and  are unique to this visit.

## 2018-12-11 ENCOUNTER — Other Ambulatory Visit: Payer: Self-pay | Admitting: Internal Medicine

## 2018-12-11 DIAGNOSIS — J449 Chronic obstructive pulmonary disease, unspecified: Secondary | ICD-10-CM

## 2018-12-11 NOTE — Progress Notes (Signed)
mychart note sent

## 2019-01-02 ENCOUNTER — Other Ambulatory Visit: Payer: Self-pay | Admitting: Family Medicine

## 2019-01-16 ENCOUNTER — Encounter: Payer: Self-pay | Admitting: Family Medicine

## 2019-01-16 DIAGNOSIS — K5090 Crohn's disease, unspecified, without complications: Secondary | ICD-10-CM | POA: Diagnosis not present

## 2019-01-16 DIAGNOSIS — Z79899 Other long term (current) drug therapy: Secondary | ICD-10-CM | POA: Diagnosis not present

## 2019-01-19 ENCOUNTER — Ambulatory Visit: Payer: BC Managed Care – PPO | Admitting: Internal Medicine

## 2019-01-31 ENCOUNTER — Other Ambulatory Visit: Payer: Self-pay

## 2019-01-31 DIAGNOSIS — Z20822 Contact with and (suspected) exposure to covid-19: Secondary | ICD-10-CM

## 2019-02-01 DIAGNOSIS — Z20828 Contact with and (suspected) exposure to other viral communicable diseases: Secondary | ICD-10-CM | POA: Diagnosis not present

## 2019-02-01 DIAGNOSIS — K5090 Crohn's disease, unspecified, without complications: Secondary | ICD-10-CM | POA: Diagnosis not present

## 2019-02-01 DIAGNOSIS — Z79899 Other long term (current) drug therapy: Secondary | ICD-10-CM | POA: Diagnosis not present

## 2019-02-01 LAB — NOVEL CORONAVIRUS, NAA: SARS-CoV-2, NAA: NOT DETECTED

## 2019-03-02 ENCOUNTER — Other Ambulatory Visit: Payer: Self-pay | Admitting: Family Medicine

## 2019-03-13 DIAGNOSIS — K5090 Crohn's disease, unspecified, without complications: Secondary | ICD-10-CM | POA: Diagnosis not present

## 2019-03-27 ENCOUNTER — Ambulatory Visit: Payer: BC Managed Care – PPO | Admitting: Internal Medicine

## 2019-04-17 DIAGNOSIS — Z20828 Contact with and (suspected) exposure to other viral communicable diseases: Secondary | ICD-10-CM | POA: Diagnosis not present

## 2019-04-18 ENCOUNTER — Telehealth: Payer: Self-pay | Admitting: Family Medicine

## 2019-04-18 NOTE — Telephone Encounter (Signed)
Spoke with pt advised that most insurance will not cover for any extra labs done. Pt states that she will call her insurance and confirm that they will cover the cost of labs. Pt advise to have her insurance send  Documentation to our office stating that its ok to have labs before her physical.

## 2019-04-18 NOTE — Telephone Encounter (Signed)
Copied from [place] [id number]. Topic: General - Other >> Apr 18, 2019 12:22 PM Keene Breath wrote: Reason for CRM: Patient called to request that her labs be done before her physical appt.  Please call to discuss and schedule labs before if possible.

## 2019-05-01 ENCOUNTER — Other Ambulatory Visit: Payer: Self-pay

## 2019-05-02 ENCOUNTER — Encounter: Payer: Self-pay | Admitting: Family Medicine

## 2019-05-02 ENCOUNTER — Ambulatory Visit (INDEPENDENT_AMBULATORY_CARE_PROVIDER_SITE_OTHER): Payer: BC Managed Care – PPO | Admitting: Family Medicine

## 2019-05-02 VITALS — BP 118/82 | HR 87 | Temp 97.8°F | Wt 139.0 lb

## 2019-05-02 DIAGNOSIS — F17210 Nicotine dependence, cigarettes, uncomplicated: Secondary | ICD-10-CM | POA: Diagnosis not present

## 2019-05-02 DIAGNOSIS — Z0000 Encounter for general adult medical examination without abnormal findings: Secondary | ICD-10-CM | POA: Diagnosis not present

## 2019-05-02 DIAGNOSIS — Z13220 Encounter for screening for lipoid disorders: Secondary | ICD-10-CM

## 2019-05-02 DIAGNOSIS — K5090 Crohn's disease, unspecified, without complications: Secondary | ICD-10-CM

## 2019-05-02 LAB — CBC WITH DIFFERENTIAL/PLATELET
Basophils Absolute: 0.1 10*3/uL (ref 0.0–0.1)
Basophils Relative: 0.8 % (ref 0.0–3.0)
Eosinophils Absolute: 0 10*3/uL (ref 0.0–0.7)
Eosinophils Relative: 0.4 % (ref 0.0–5.0)
HCT: 39.2 % (ref 36.0–46.0)
Hemoglobin: 12.7 g/dL (ref 12.0–15.0)
Lymphocytes Relative: 24.5 % (ref 12.0–46.0)
Lymphs Abs: 1.7 10*3/uL (ref 0.7–4.0)
MCHC: 32.5 g/dL (ref 30.0–36.0)
MCV: 88.1 fl (ref 78.0–100.0)
Monocytes Absolute: 0.6 10*3/uL (ref 0.1–1.0)
Monocytes Relative: 9.2 % (ref 3.0–12.0)
Neutro Abs: 4.4 10*3/uL (ref 1.4–7.7)
Neutrophils Relative %: 65.1 % (ref 43.0–77.0)
Platelets: 280 10*3/uL (ref 150.0–400.0)
RBC: 4.44 Mil/uL (ref 3.87–5.11)
RDW: 14.5 % (ref 11.5–15.5)
WBC: 6.8 10*3/uL (ref 4.0–10.5)

## 2019-05-02 LAB — LIPID PANEL
Cholesterol: 177 mg/dL (ref 0–200)
HDL: 46.8 mg/dL (ref >39.00)
LDL Cholesterol: 114 mg/dL — ABNORMAL HIGH (ref 0–99)
NonHDL: 129.74
Total CHOL/HDL Ratio: 4
Triglycerides: 80 mg/dL (ref 0.0–149.0)
VLDL: 16 mg/dL (ref 0.0–40.0)

## 2019-05-02 LAB — COMPREHENSIVE METABOLIC PANEL
ALT: 11 U/L (ref 0–35)
AST: 18 U/L (ref 0–37)
Albumin: 4.4 g/dL (ref 3.5–5.2)
Alkaline Phosphatase: 36 U/L — ABNORMAL LOW (ref 39–117)
BUN: 6 mg/dL (ref 6–23)
CO2: 28 mEq/L (ref 19–32)
Calcium: 9.5 mg/dL (ref 8.4–10.5)
Chloride: 104 mEq/L (ref 96–112)
Creatinine, Ser: 0.89 mg/dL (ref 0.40–1.20)
GFR: 69.09 mL/min (ref >60.00)
Glucose, Bld: 95 mg/dL (ref 70–99)
Potassium: 4.3 mEq/L (ref 3.5–5.1)
Sodium: 138 mEq/L (ref 135–145)
Total Bilirubin: 0.3 mg/dL (ref 0.2–1.2)
Total Protein: 7.3 g/dL (ref 6.0–8.3)

## 2019-05-02 NOTE — Patient Instructions (Signed)

## 2019-05-02 NOTE — Progress Notes (Signed)
Subjective:     Amanda Klein is a 43 y.o. female and is here for a comprehensive physical exam. The patient reports no problems.  Pt doing well overall.  Just bought a new townhome in Oct.  Pt quit smoking for a few months, then restarted.  Trying to quit again.  Smoking <1/2 ppd.  Followed by GI for Crohn's dz, on remicade.  Seeing Pulm ofr asthma.  Pt inquires if drinking 1 gal of water daily is too much.  She has been challenging the guys at work by drinking 1 gal to prove its not that hard to do.  Pt denies dizziness, lightheadedness, diarrhea, n/v, fatigue.  Pt had a piece of chocolate and coffee this am.  Social History   Socioeconomic History  . Marital status: Single    Spouse name: Not on file  . Number of children: 0  . Years of education: Not on file  . Highest education level: Not on file  Occupational History  . Not on file  Tobacco Use  . Smoking status: Current Every Day Smoker    Packs/day: 1.00    Years: 26.00    Pack years: 26.00    Types: Cigarettes  . Smokeless tobacco: Never Used  Substance and Sexual Activity  . Alcohol use: Yes    Comment: wine on special occasions  . Drug use: Yes    Types: Marijuana    Comment: OCCASSIONAL  . Sexual activity: Not Currently  Other Topics Concern  . Not on file  Social History Narrative  . Not on file   Social Determinants of Health   Financial Resource Strain:   . Difficulty of Paying Living Expenses: Not on file  Food Insecurity:   . Worried About Charity fundraiser in the Last Year: Not on file  . Ran Out of Food in the Last Year: Not on file  Transportation Needs:   . Lack of Transportation (Medical): Not on file  . Lack of Transportation (Non-Medical): Not on file  Physical Activity:   . Days of Exercise per Week: Not on file  . Minutes of Exercise per Session: Not on file  Stress:   . Feeling of Stress : Not on file  Social Connections:   . Frequency of Communication with Friends and Family: Not on  file  . Frequency of Social Gatherings with Friends and Family: Not on file  . Attends Religious Services: Not on file  . Active Member of Clubs or Organizations: Not on file  . Attends Archivist Meetings: Not on file  . Marital Status: Not on file  Intimate Partner Violence:   . Fear of Current or Ex-Partner: Not on file  . Emotionally Abused: Not on file  . Physically Abused: Not on file  . Sexually Abused: Not on file   Health Maintenance  Topic Date Due  . TETANUS/TDAP  07/13/2019 (Originally 12/03/1994)  . PAP SMEAR-Modifier  11/04/2020  . INFLUENZA VACCINE  Completed  . HIV Screening  Completed    The following portions of the patient's history were reviewed and updated as appropriate: allergies, current medications, past family history, past medical history, past social history, past surgical history and problem list.  Review of Systems Pertinent items noted in HPI and remainder of comprehensive ROS otherwise negative.   Objective:    BP 118/82 (BP Location: Left Arm, Patient Position: Sitting, Cuff Size: Normal)   Pulse 87   Temp 97.8 F (36.6 C) (Temporal)   Wt  139 lb (63 kg)   LMP 04/19/2019 (Exact Date)   SpO2 98%   BMI 25.42 kg/m  General appearance: alert, cooperative and no distress Head: Normocephalic, without obvious abnormality, atraumatic Eyes: conjunctivae/corneas clear. PERRL, EOM's intact. Fundi benign. Ears: normal TM's and external ear canals both ears Nose: Nares normal. Septum midline. Mucosa normal. No drainage or sinus tenderness. Throat: lips, mucosa, and tongue normal; teeth and gums normal Neck: no adenopathy, no carotid bruit, no JVD, supple, symmetrical, trachea midline and thyroid not enlarged, symmetric, no tenderness/mass/nodules Lungs: clear to auscultation bilaterally Heart: regular rate and rhythm, S1, S2 normal, no murmur, click, rub or gallop Abdomen: soft, non-tender; bowel sounds normal; no masses,  no  organomegaly Extremities: extremities normal, atraumatic, no cyanosis or edema Pulses: 2+ and symmetric Skin: Skin color, texture, turgor normal. No rashes or lesions Lymph nodes: Cervical, supraclavicular, and axillary nodes normal. Neurologic: Alert and oriented X 3, normal strength and tone. Normal symmetric reflexes. Normal coordination and gait    Assessment:    Healthy female exam.      Plan:     Anticipatory guidance given including wearing seatbelts, smoke detectors in the home, increasing physical activity, increasing p.o. intake of water and vegetables. -will obtain labs -pt to schedule mammogram, given handout -pap up to date, done 11/04/17.  Next pap in 2024 -given handout -next CPE in 1 yr See After Visit Summary for Counseling Recommendations    Cigarette nicotine dependence without complication -Smoking cessation counseling greater than 3 minutes, less than 10 minutes -Smoking less than 1/2 pack/day -discussed cutting down -consider quit aids -given handout -will reassess at each visit.  Crohn's disease without complication, unspecified gastrointestinal tract location [facility])  -continue remicade -continue f/u with GI, Dr. Ardis Hughs - Plan: CMP  Screening for cholesterol level  -nonfasting - Plan: Lipid Panel  F/u prn  Grier Mitts, MD  This note is not being shared with the patient for the following reason: To respect privacy (The patient or proxy has requested that the information not be shared).

## 2019-05-03 DIAGNOSIS — Z20828 Contact with and (suspected) exposure to other viral communicable diseases: Secondary | ICD-10-CM | POA: Diagnosis not present

## 2019-05-05 ENCOUNTER — Other Ambulatory Visit: Payer: Self-pay | Admitting: Family Medicine

## 2019-05-07 ENCOUNTER — Other Ambulatory Visit: Payer: Self-pay | Admitting: Family Medicine

## 2019-05-07 DIAGNOSIS — Z1231 Encounter for screening mammogram for malignant neoplasm of breast: Secondary | ICD-10-CM

## 2019-05-08 DIAGNOSIS — K5090 Crohn's disease, unspecified, without complications: Secondary | ICD-10-CM | POA: Diagnosis not present

## 2019-05-16 DIAGNOSIS — Z20828 Contact with and (suspected) exposure to other viral communicable diseases: Secondary | ICD-10-CM | POA: Diagnosis not present

## 2019-05-24 ENCOUNTER — Other Ambulatory Visit: Payer: BC Managed Care – PPO

## 2019-05-27 DIAGNOSIS — Z20828 Contact with and (suspected) exposure to other viral communicable diseases: Secondary | ICD-10-CM | POA: Diagnosis not present

## 2019-06-22 ENCOUNTER — Other Ambulatory Visit: Payer: Self-pay

## 2019-06-22 ENCOUNTER — Ambulatory Visit
Admission: RE | Admit: 2019-06-22 | Discharge: 2019-06-22 | Disposition: A | Discharge status: Home / Self Care | Payer: BC Managed Care – PPO | Source: Ambulatory Visit | Attending: Family Medicine | Admitting: Family Medicine

## 2019-06-22 DIAGNOSIS — Z1231 Encounter for screening mammogram for malignant neoplasm of breast: Secondary | ICD-10-CM

## 2019-06-29 DIAGNOSIS — Z20828 Contact with and (suspected) exposure to other viral communicable diseases: Secondary | ICD-10-CM | POA: Diagnosis not present

## 2019-06-29 LAB — HM MAMMOGRAPHY

## 2019-07-03 DIAGNOSIS — K5090 Crohn's disease, unspecified, without complications: Secondary | ICD-10-CM | POA: Diagnosis not present

## 2019-07-05 ENCOUNTER — Encounter: Payer: Self-pay | Admitting: Family Medicine

## 2019-07-12 DIAGNOSIS — Z20828 Contact with and (suspected) exposure to other viral communicable diseases: Secondary | ICD-10-CM | POA: Diagnosis not present

## 2019-07-19 ENCOUNTER — Other Ambulatory Visit: Payer: Self-pay | Admitting: Family Medicine

## 2019-07-19 DIAGNOSIS — J302 Other seasonal allergic rhinitis: Secondary | ICD-10-CM

## 2019-07-25 DIAGNOSIS — Z20828 Contact with and (suspected) exposure to other viral communicable diseases: Secondary | ICD-10-CM | POA: Diagnosis not present

## 2019-08-08 DIAGNOSIS — Z20828 Contact with and (suspected) exposure to other viral communicable diseases: Secondary | ICD-10-CM | POA: Diagnosis not present

## 2019-08-10 DIAGNOSIS — Z720 Tobacco use: Secondary | ICD-10-CM | POA: Diagnosis not present

## 2019-08-17 ENCOUNTER — Other Ambulatory Visit: Payer: Self-pay | Admitting: Family Medicine

## 2019-08-22 DIAGNOSIS — Z20828 Contact with and (suspected) exposure to other viral communicable diseases: Secondary | ICD-10-CM | POA: Diagnosis not present

## 2019-08-28 DIAGNOSIS — K5090 Crohn's disease, unspecified, without complications: Secondary | ICD-10-CM | POA: Diagnosis not present

## 2019-09-05 DIAGNOSIS — Z20822 Contact with and (suspected) exposure to covid-19: Secondary | ICD-10-CM | POA: Diagnosis not present

## 2019-09-05 DIAGNOSIS — Z20828 Contact with and (suspected) exposure to other viral communicable diseases: Secondary | ICD-10-CM | POA: Diagnosis not present

## 2019-09-13 ENCOUNTER — Other Ambulatory Visit: Payer: Self-pay | Admitting: Family Medicine

## 2019-09-19 DIAGNOSIS — Z03818 Encounter for observation for suspected exposure to other biological agents ruled out: Secondary | ICD-10-CM | POA: Diagnosis not present

## 2019-09-19 DIAGNOSIS — Z20828 Contact with and (suspected) exposure to other viral communicable diseases: Secondary | ICD-10-CM | POA: Diagnosis not present

## 2019-10-04 DIAGNOSIS — Z20828 Contact with and (suspected) exposure to other viral communicable diseases: Secondary | ICD-10-CM | POA: Diagnosis not present

## 2019-10-04 DIAGNOSIS — Z20822 Contact with and (suspected) exposure to covid-19: Secondary | ICD-10-CM | POA: Diagnosis not present

## 2019-10-30 DIAGNOSIS — K5090 Crohn's disease, unspecified, without complications: Secondary | ICD-10-CM | POA: Diagnosis not present

## 2019-11-20 DIAGNOSIS — Z720 Tobacco use: Secondary | ICD-10-CM | POA: Diagnosis not present

## 2019-11-21 ENCOUNTER — Encounter: Payer: Self-pay | Admitting: Gastroenterology

## 2019-11-21 ENCOUNTER — Other Ambulatory Visit (INDEPENDENT_AMBULATORY_CARE_PROVIDER_SITE_OTHER): Payer: BC Managed Care – PPO

## 2019-11-21 ENCOUNTER — Ambulatory Visit: Payer: BC Managed Care – PPO | Admitting: Gastroenterology

## 2019-11-21 VITALS — BP 112/66 | HR 70 | Ht 62.5 in | Wt 143.0 lb

## 2019-11-21 DIAGNOSIS — K50119 Crohn's disease of large intestine with unspecified complications: Secondary | ICD-10-CM

## 2019-11-21 LAB — SEDIMENTATION RATE: Sed Rate: 8 mm/hr (ref 0–20)

## 2019-11-21 LAB — COMPREHENSIVE METABOLIC PANEL
ALT: 7 U/L (ref 0–35)
AST: 15 U/L (ref 0–37)
Albumin: 4.3 g/dL (ref 3.5–5.2)
Alkaline Phosphatase: 36 U/L — ABNORMAL LOW (ref 39–117)
BUN: 10 mg/dL (ref 6–23)
CO2: 29 mEq/L (ref 19–32)
Calcium: 9.1 mg/dL (ref 8.4–10.5)
Chloride: 101 mEq/L (ref 96–112)
Creatinine, Ser: 0.99 mg/dL (ref 0.40–1.20)
GFR: 60.94 mL/min (ref >60.00)
Glucose, Bld: 120 mg/dL — ABNORMAL HIGH (ref 70–99)
Potassium: 3.4 mEq/L — ABNORMAL LOW (ref 3.5–5.1)
Sodium: 137 mEq/L (ref 135–145)
Total Bilirubin: 0.5 mg/dL (ref 0.2–1.2)
Total Protein: 7.3 g/dL (ref 6.0–8.3)

## 2019-11-21 LAB — CBC
HCT: 36.8 % (ref 36.0–46.0)
Hemoglobin: 12.1 g/dL (ref 12.0–15.0)
MCHC: 32.8 g/dL (ref 30.0–36.0)
MCV: 83.8 fl (ref 78.0–100.0)
Platelets: 251 10*3/uL (ref 150.0–400.0)
RBC: 4.38 Mil/uL (ref 3.87–5.11)
RDW: 15.1 % (ref 11.5–15.5)
WBC: 6.8 10*3/uL (ref 4.0–10.5)

## 2019-11-21 LAB — C-REACTIVE PROTEIN: CRP: <1 mg/dL (ref 0.5–20.0)

## 2019-11-21 NOTE — Progress Notes (Signed)
Review of pertinent gastrointestinal problems: 1. Crohn's ileocolitis; diagnosed 2012 elsewhere.  Establish care with me October 2019.  Colonoscopy February 2014 [place] found deep ulcer at the [facility].  Also "erythema and innumerable deep large serpentine ulcers from the sigmoid through the transverse colon.  Few aphthous ulcers in the a sending and cecum.  Few aphthous ulcers in the terminal ileum just inside the IC valve.  Path showed active inflammation in the terminal ileum and right colon, severe active inflammation of the transverse, focal active inflammation in the rectum.  Negative for granulomas throughout however the pathologist commented that the features were suggestive of IBD, Crohn's disease EGD February 2014 [facility] normal except for mild erythema and erosions in the gastric body. Colonoscopy December 2015 done for "follow-up of Crohn's disease".showed "multiple scars in the transverse colon, splenic flexure, descending colon, and sigmoid colon. Terminal ileum was not intubated.  HPI: This is a very pleasant 44 year old woman whom I last saw her about 1 year ago.  Clinically she was doing very well on Remicade 5 mg/kg every 8 weeks.  Repeat labs including CBC, complete metabolic profile, sed rate, CRP and QuantiFERON-TB gold were all completely normal.  She has had 13 Covid tests in the past 9 to 10 months as part of work.  She works in Human resources officer.  All of these tests were negative.  She is really done well into the last 3 to 4 months and she has noticed a single fairly loose stool every morning.  She does not move her bowels other than that.  She has no overt bleeding.  No significant abdominal pains.  This is not reminiscent of when she presented with Crohn's initially 9 years ago when she was having severe terrible unrelenting diarrhea.  She has not been on antibiotics in the past 3 to 6 months that she is aware of.  Her weight is overall up  about 7 pounds in the past several months.  ROS: complete GI ROS as described in HPI, all other review negative.  Constitutional:  No unintentional weight loss   Past Medical History:  Diagnosis Date  . Kidney stones     Past Surgical History:  Procedure Laterality Date  . CARPAL TUNNEL RELEASE Right   . KNOT EXCISION Left    forehead above eye, age 66  . URETHRA SURGERY      Current Outpatient Medications  Medication Sig Dispense Refill  . cetirizine (ZYRTEC) 10 MG tablet TAKE 1 TABLET BY MOUTH EVERY DAY 90 tablet 3  . fluticasone (FLONASE) 50 MCG/ACT nasal spray Place 1 spray into both nostrils daily. 11.1 g 11  . inFLIXimab (REMICADE) 100 MG injection Inject into the vein every 8 (eight) weeks.    . pantoprazole (PROTONIX) 40 MG tablet TAKE 1 TABLET BY MOUTH EVERY DAY 30 tablet 1   No current facility-administered medications for this visit.    Allergies as of 11/21/2019  . (No Known Allergies)    Family History  Problem Relation Age of Onset  . Breast cancer Mother   . Heart disease Father   . Skin cancer Father   . Breast cancer Paternal Grandmother   . Hyperlipidemia Brother     Social History   Socioeconomic History  . Marital status: Single    Spouse name: Not on file  . Number of children: 0  . Years of education: Not on file  . Highest education level: Not on file  Occupational History  .  Not on file  Tobacco Use  . Smoking status: Current Every Day Smoker    Packs/day: 1.00    Years: 26.00    Pack years: 26.00    Types: Cigarettes  . Smokeless tobacco: Never Used  Vaping Use  . Vaping Use: Never used  Substance and Sexual Activity  . Alcohol use: Yes    Comment: wine on special occasions  . Drug use: Yes    Types: Marijuana    Comment: OCCASSIONAL  . Sexual activity: Not Currently  Other Topics Concern  . Not on file  Social History Narrative  . Not on file   Social Determinants of Health   Financial Resource Strain:   .  Difficulty of Paying Living Expenses:   Food Insecurity:   . Worried About Charity fundraiser in the Last Year:   . Arboriculturist in the Last Year:   Transportation Needs:   . Film/video editor (Medical):   Marland Kitchen Lack of Transportation (Non-Medical):   Physical Activity:   . Days of Exercise per Week:   . Minutes of Exercise per Session:   Stress:   . Feeling of Stress :   Social Connections:   . Frequency of Communication with Friends and Family:   . Frequency of Social Gatherings with Friends and Family:   . Attends Religious Services:   . Active Member of Clubs or Organizations:   . Attends Archivist Meetings:   Marland Kitchen Marital Status:   Intimate Partner Violence:   . Fear of Current or Ex-Partner:   . Emotionally Abused:   Marland Kitchen Physically Abused:   . Sexually Abused:      Physical Exam: BP 112/66   Pulse 70   Ht 5' 2.5" (1.588 m)   Wt 143 lb (64.9 kg)   BMI 25.74 kg/m  Constitutional: generally well-appearing Psychiatric: alert and oriented x3 Abdomen: soft, nontender, nondistended, no obvious ascites, no peritoneal signs, normal bowel sounds No peripheral edema noted in lower extremities  Assessment and plan: 44 y.o. female with Crohn's ileocolitis  Recent loose stools.  I am not sure if this indicates a Crohn's flare.  Certainly if it is it is quite a mild flare.  Recommend blood tests as well as stool testing including CBC, complete metabolic profile, inflammatory markers, GI pathogen panel, Remicade associated antibodies now.  She will also get trough Remicade drug concentration just prior to her next every 8-week dose which will be in about 4 to 6 weeks from now.  We will arrange follow-up based on the results of the above test.  Please see the "Patient Instructions" section for addition details about the plan.  Owens Loffler, MD [facility] 11/21/2019, 3:59 PM   Total time on date of encounter was 30 minutes (this included time spent  preparing to see the patient reviewing records; obtaining and/or reviewing separately obtained history; performing a medically appropriate exam and/or evaluation; counseling and educating the patient and family if present; ordering medications, tests or procedures if applicable; and documenting clinical information in the health record).

## 2019-11-21 NOTE — Patient Instructions (Signed)
If you are age 44 or older, your body mass index should be between 23-30. Your Body mass index is 25.74 kg/m. If this is out of the aforementioned range listed, please consider follow up with your Primary Care Provider.  If you are age 7 or younger, your body mass index should be between 19-25. Your Body mass index is 25.74 kg/m. If this is out of the aformentioned range listed, please consider follow up with your Primary Care Provider.   Your provider has requested that you go to the basement level for lab work before leaving today. Press "B" on the elevator. The lab is located at the first door on the left as you exit the elevator.  Due to recent changes in healthcare laws, you may see the results of your imaging and laboratory studies on MyChart before your provider has had a chance to review them.  We understand that in some cases there may be results that are confusing or concerning to you. Not all laboratory results come back in the same time frame and the provider may be waiting for multiple results in order to interpret others.  Please give [place] 48 hours in order for your provider to thoroughly review all the results before contacting the office for clarification of your results.   You will return for lab work 1 day prior to next Remicade infusion. (Approximately 12-27-19)  Thank you for entrusting me with your care and choosing [facility].  Dr Ardis Hughs

## 2019-11-26 LAB — SERIAL MONITORING

## 2019-11-27 LAB — INFLIXIMAB+AB (SERIAL MONITOR)
Anti-Infliximab Antibody: <22 ng/mL
Infliximab Drug Level: 24 ug/mL

## 2019-12-05 ENCOUNTER — Other Ambulatory Visit: Payer: Self-pay

## 2019-12-05 LAB — INFLIXIMAB LEVEL AND ADA FOR IBD
Infliximab ADA, IBD: <10 AU (ref <10)
Infliximab Level, IBD: 23.3 ug/mL

## 2019-12-05 LAB — INFLIXIMAB LEVEL,ADA FOR RHEUMATIC DZ
INFLIXIMAB ADA, RHEUM: <10 AU (ref <10)
INFLIXIMAB LEVEL, RHEUM: 23.7 ug/mL

## 2019-12-07 ENCOUNTER — Other Ambulatory Visit: Payer: Self-pay

## 2019-12-07 ENCOUNTER — Encounter: Payer: Self-pay | Admitting: Family Medicine

## 2019-12-07 DIAGNOSIS — K50119 Crohn's disease of large intestine with unspecified complications: Secondary | ICD-10-CM

## 2019-12-19 ENCOUNTER — Other Ambulatory Visit: Payer: Self-pay | Admitting: Family Medicine

## 2019-12-19 DIAGNOSIS — F17210 Nicotine dependence, cigarettes, uncomplicated: Secondary | ICD-10-CM

## 2019-12-19 MED ORDER — CHANTIX STARTING MONTH PAK 0.5 MG X 11 & 1 MG X 42 PO TABS: ORAL_TABLET | ORAL | 0 refills | Status: DC

## 2019-12-25 ENCOUNTER — Telehealth: Payer: Self-pay | Admitting: Gastroenterology

## 2019-12-25 NOTE — Telephone Encounter (Signed)
The pt has been advised that she needs to have her remicade level 1 day prior to her infusion here at our office.  The pt has been advised of the information and verbalized understanding.

## 2020-01-07 ENCOUNTER — Telehealth: Payer: Self-pay | Admitting: Gastroenterology

## 2020-01-07 DIAGNOSIS — K5090 Crohn's disease, unspecified, without complications: Secondary | ICD-10-CM | POA: Diagnosis not present

## 2020-01-07 DIAGNOSIS — Z79899 Other long term (current) drug therapy: Secondary | ICD-10-CM | POA: Diagnosis not present

## 2020-01-07 NOTE — Telephone Encounter (Signed)
Spoke with patient, she states that she was supposed to come in last Friday for labs before her infusion today but she couldn't due to work. Pt is wanting to know if we can fax order over to infusion center to get labs drawn before infusion. Pt is receiving Remicade Infusion at [facility].  I called and spoke with Beth in the infusion suite - she stated that they can draw the labs, will fax order to [phone]. Patient aware.

## 2020-01-09 DIAGNOSIS — Z79899 Other long term (current) drug therapy: Secondary | ICD-10-CM | POA: Diagnosis not present

## 2020-01-09 DIAGNOSIS — K5090 Crohn's disease, unspecified, without complications: Secondary | ICD-10-CM | POA: Diagnosis not present

## 2020-01-25 ENCOUNTER — Telehealth: Payer: Self-pay | Admitting: Gastroenterology

## 2020-01-25 NOTE — Telephone Encounter (Signed)
Results have been faxed to Dr Ardis Hughs and will be put on his desk for review.

## 2020-02-01 ENCOUNTER — Telehealth: Payer: Self-pay | Admitting: Gastroenterology

## 2020-02-01 NOTE — Telephone Encounter (Signed)
I spoke with the pt and she confirms that her labs were completed 1 day prior to the infusion.  However, her remicade was done at 10 weeks in stead of 8 weeks.  She had to reschedule her appt and that pushed her out by 2 weeks.

## 2020-02-01 NOTE — Telephone Encounter (Signed)
Please let her know that that would certainly explain lower than ideal Remicade blood concentration.  She should continue her Remicade every 8 weeks and she needs return office visit with me in January 2022.  Thanks

## 2020-02-01 NOTE — Telephone Encounter (Signed)
The patient has been notified of this information and all questions answered. The pt will call back to make follow up appt

## 2020-02-01 NOTE — Telephone Encounter (Signed)
I reviewed outside labs drawn by [facility] where she gets her infliximab infusions.  These labs were drawn on January 18, 2020.  Infliximab drug level 3.3 mcg/mL.Marland Kitchen Antiinfliximab antibodies less than 22    Can you please contact her?  Let her know I saw her labs and can you also confirm with her that these were done a day or 2 prior to her Remicade infusion recently.  Ask her how she is feeling.  Her circulating Remicade level is a bit lower than we consider to be ideal.  She does not have circulating antibodies to Remicade fortunately.  May need to dose escalate however I want to be a bit more sure that she is actually having Crohn's related inflammation.  Certainly her inflammatory markers 2 months ago were both completely normal, this argues against active Crohn's disease.

## 2020-03-03 DIAGNOSIS — K5090 Crohn's disease, unspecified, without complications: Secondary | ICD-10-CM | POA: Diagnosis not present

## 2020-03-06 ENCOUNTER — Other Ambulatory Visit: Payer: Self-pay | Admitting: Family Medicine

## 2020-03-17 DIAGNOSIS — Z0000 Encounter for general adult medical examination without abnormal findings: Secondary | ICD-10-CM | POA: Diagnosis not present

## 2020-03-17 DIAGNOSIS — J3089 Other allergic rhinitis: Secondary | ICD-10-CM | POA: Diagnosis not present

## 2020-03-17 DIAGNOSIS — R5383 Other fatigue: Secondary | ICD-10-CM | POA: Diagnosis not present

## 2020-03-17 DIAGNOSIS — R7309 Other abnormal glucose: Secondary | ICD-10-CM | POA: Diagnosis not present

## 2020-03-19 DIAGNOSIS — K5090 Crohn's disease, unspecified, without complications: Secondary | ICD-10-CM | POA: Diagnosis not present

## 2020-03-19 DIAGNOSIS — R5383 Other fatigue: Secondary | ICD-10-CM | POA: Diagnosis not present

## 2020-03-19 DIAGNOSIS — Z0000 Encounter for general adult medical examination without abnormal findings: Secondary | ICD-10-CM | POA: Diagnosis not present

## 2020-03-19 DIAGNOSIS — R7309 Other abnormal glucose: Secondary | ICD-10-CM | POA: Diagnosis not present

## 2020-03-19 DIAGNOSIS — Z79899 Other long term (current) drug therapy: Secondary | ICD-10-CM | POA: Diagnosis not present

## 2020-03-29 IMAGING — DX CHEST - 2 VIEW
2 series · 2 of 2 positions shown · non-contrast
Comparison: None

CLINICAL DATA: Asthmatic bronchitis, chronic, cough and shortness
of breath

EXAM:
CHEST - 2 VIEW

[chest pa]
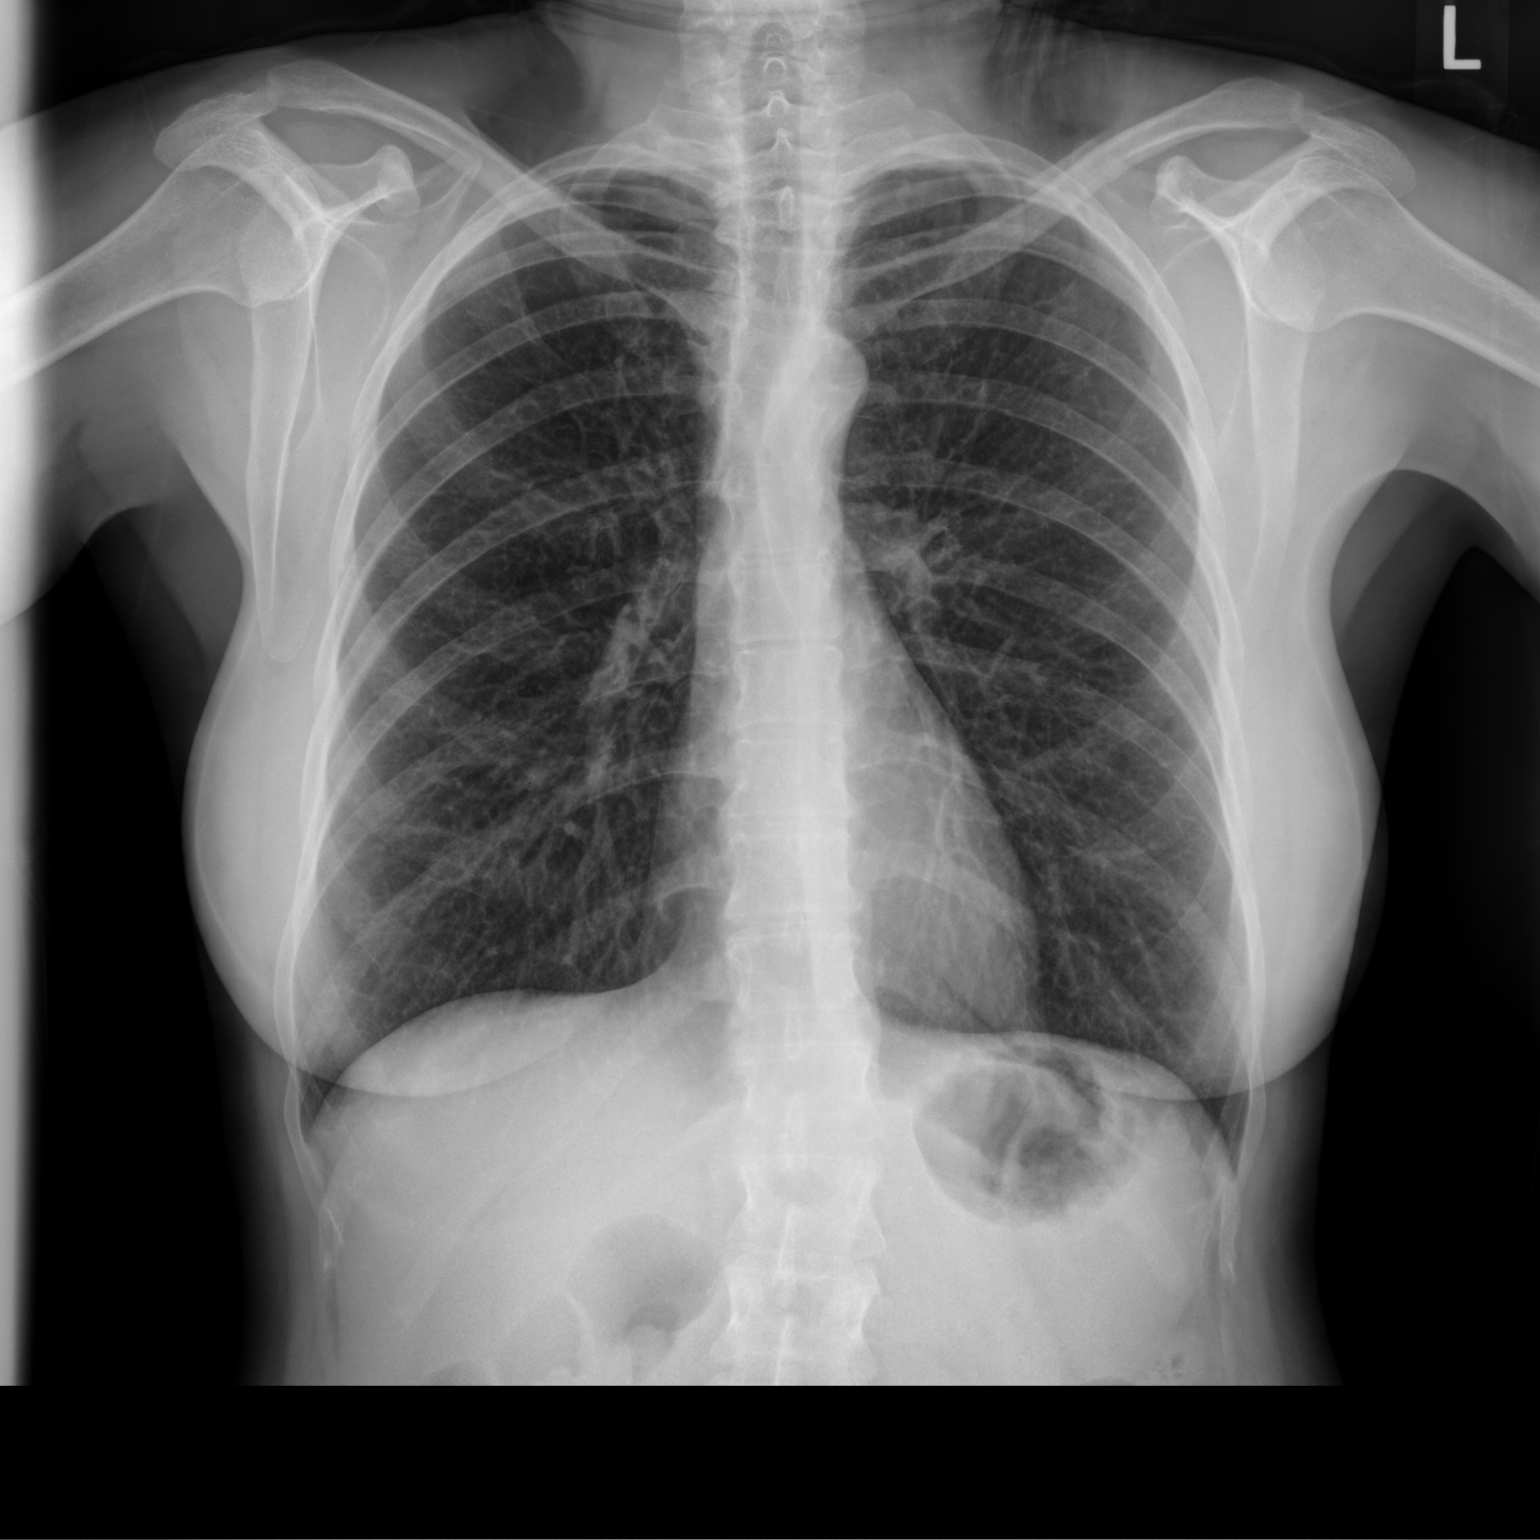

[chest lat]
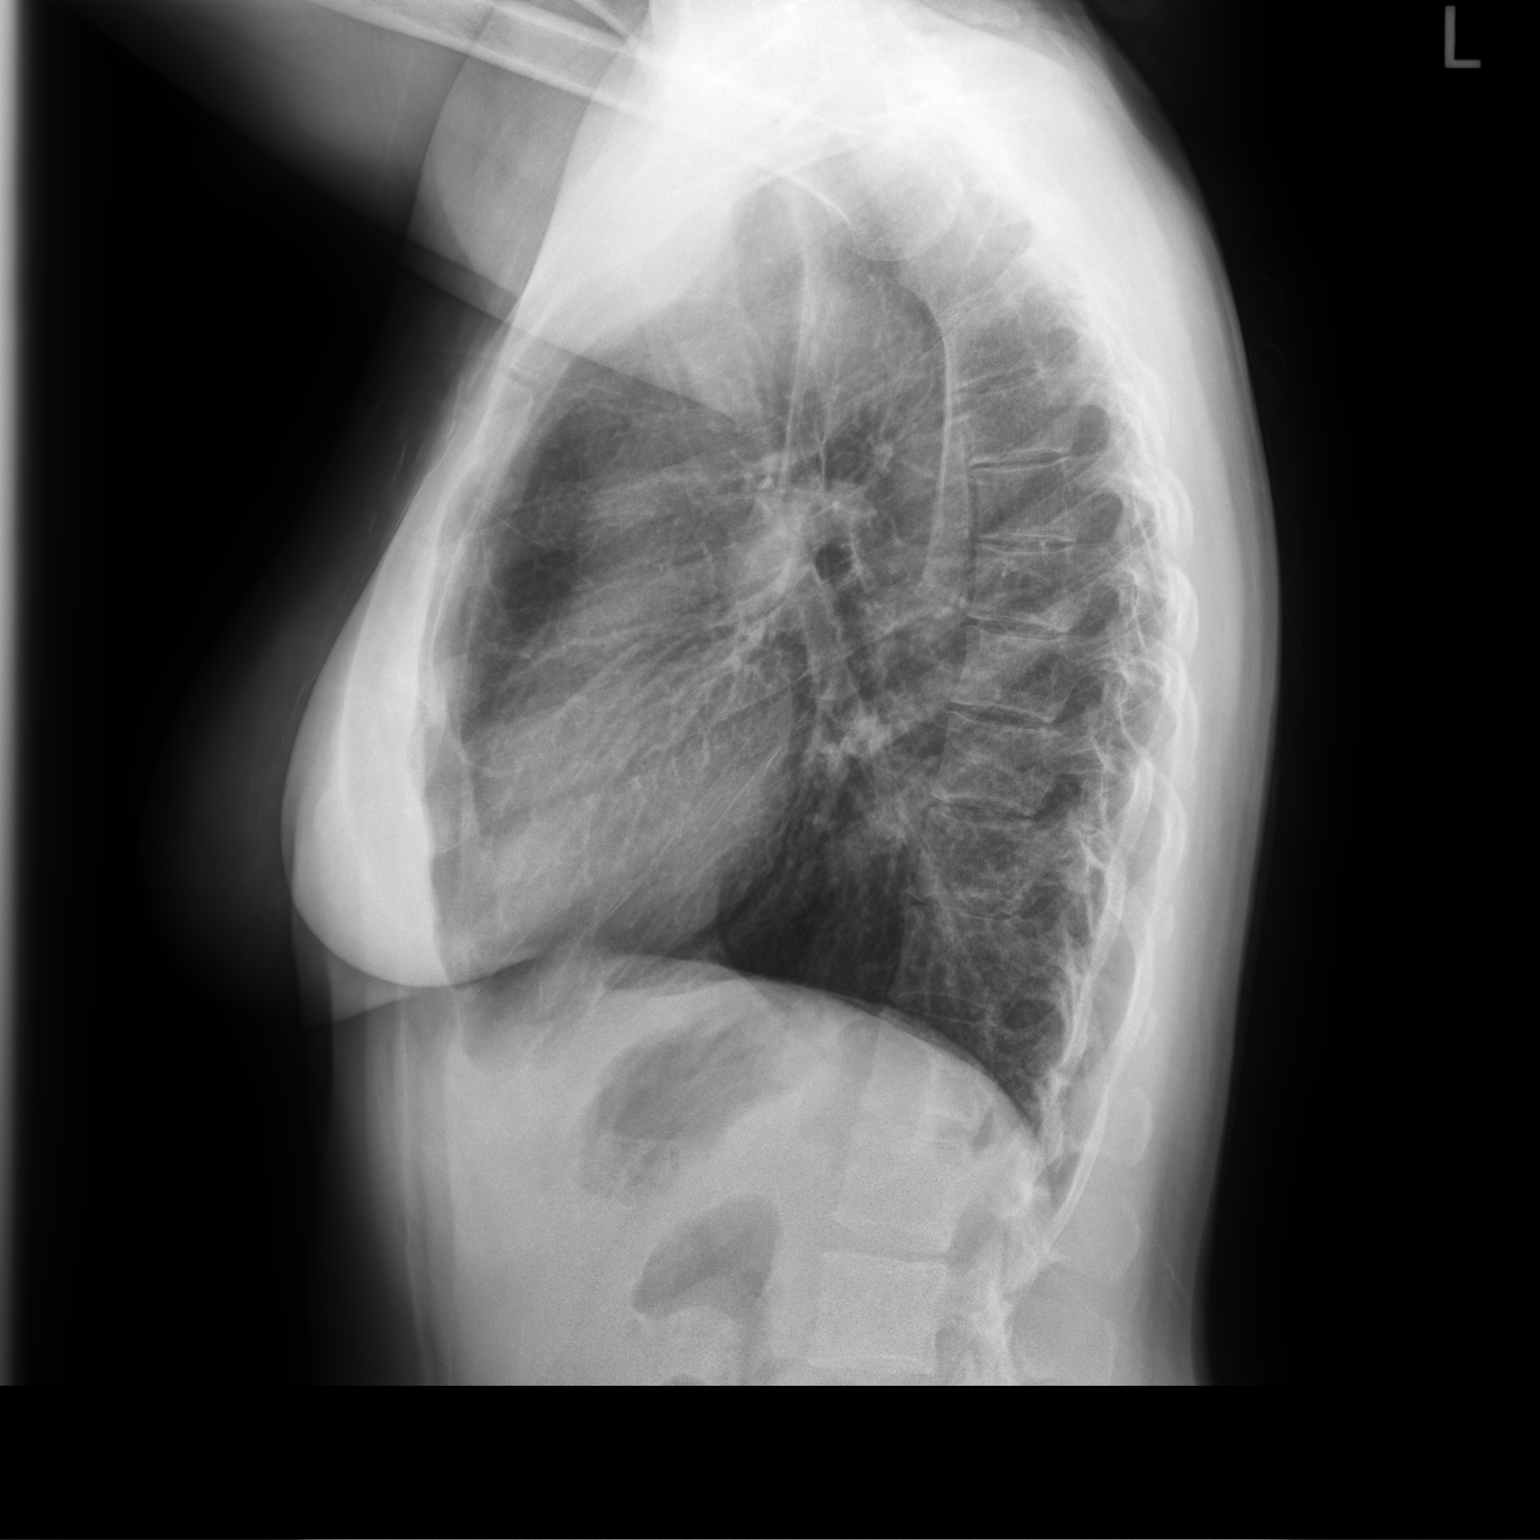

[2 of 2 positions shown; findings below may reference images not displayed]

FINDINGS: Normal heart size, mediastinal contours, and pulmonary vascularity.

Lungs mildly hyperinflated but clear.

No acute infiltrate, pleural effusion or pneumothorax.

Bones unremarkable.
IMPRESSION: Hyperinflated lungs without acute infiltrate.

## 2020-03-30 ENCOUNTER — Other Ambulatory Visit: Payer: Self-pay | Admitting: Family Medicine

## 2020-03-31 NOTE — Telephone Encounter (Signed)
Pt needs appointment for further refills 

## 2020-04-29 DIAGNOSIS — K5090 Crohn's disease, unspecified, without complications: Secondary | ICD-10-CM | POA: Diagnosis not present

## 2020-05-03 ENCOUNTER — Other Ambulatory Visit: Payer: Self-pay | Admitting: Family Medicine

## 2020-05-22 DIAGNOSIS — Z20822 Contact with and (suspected) exposure to covid-19: Secondary | ICD-10-CM | POA: Diagnosis not present

## 2020-06-04 ENCOUNTER — Other Ambulatory Visit: Payer: Self-pay | Admitting: Family Medicine

## 2020-06-04 NOTE — Telephone Encounter (Signed)
Pt needs appointment for further refills 

## 2020-06-09 DIAGNOSIS — Z20822 Contact with and (suspected) exposure to covid-19: Secondary | ICD-10-CM | POA: Diagnosis not present

## 2020-06-17 ENCOUNTER — Ambulatory Visit: Payer: BC Managed Care – PPO | Admitting: Gastroenterology

## 2020-06-18 ENCOUNTER — Other Ambulatory Visit: Payer: Self-pay | Admitting: Family Medicine

## 2020-06-18 NOTE — Telephone Encounter (Signed)
Pt needs appointment for further refills 

## 2020-06-23 DIAGNOSIS — Z20822 Contact with and (suspected) exposure to covid-19: Secondary | ICD-10-CM | POA: Diagnosis not present

## 2020-06-25 DIAGNOSIS — K5090 Crohn's disease, unspecified, without complications: Secondary | ICD-10-CM | POA: Diagnosis not present

## 2020-07-02 DIAGNOSIS — Z20822 Contact with and (suspected) exposure to covid-19: Secondary | ICD-10-CM | POA: Diagnosis not present

## 2020-07-09 DIAGNOSIS — Z20822 Contact with and (suspected) exposure to covid-19: Secondary | ICD-10-CM | POA: Diagnosis not present

## 2020-07-19 DIAGNOSIS — Z20822 Contact with and (suspected) exposure to covid-19: Secondary | ICD-10-CM | POA: Diagnosis not present

## 2020-07-23 DIAGNOSIS — Z20822 Contact with and (suspected) exposure to covid-19: Secondary | ICD-10-CM | POA: Diagnosis not present

## 2020-07-31 DIAGNOSIS — Z20822 Contact with and (suspected) exposure to covid-19: Secondary | ICD-10-CM | POA: Diagnosis not present

## 2020-08-05 DIAGNOSIS — Z20822 Contact with and (suspected) exposure to covid-19: Secondary | ICD-10-CM | POA: Diagnosis not present

## 2020-08-12 DIAGNOSIS — Z20822 Contact with and (suspected) exposure to covid-19: Secondary | ICD-10-CM | POA: Diagnosis not present

## 2020-08-20 DIAGNOSIS — K5090 Crohn's disease, unspecified, without complications: Secondary | ICD-10-CM | POA: Diagnosis not present

## 2020-08-25 DIAGNOSIS — Z20822 Contact with and (suspected) exposure to covid-19: Secondary | ICD-10-CM | POA: Diagnosis not present

## 2020-09-04 DIAGNOSIS — Z20822 Contact with and (suspected) exposure to covid-19: Secondary | ICD-10-CM | POA: Diagnosis not present

## 2020-09-10 DIAGNOSIS — Z20822 Contact with and (suspected) exposure to covid-19: Secondary | ICD-10-CM | POA: Diagnosis not present

## 2020-10-15 DIAGNOSIS — K5090 Crohn's disease, unspecified, without complications: Secondary | ICD-10-CM | POA: Diagnosis not present

## 2020-12-09 DIAGNOSIS — K5090 Crohn's disease, unspecified, without complications: Secondary | ICD-10-CM | POA: Diagnosis not present

## 2021-01-27 ENCOUNTER — Telehealth: Payer: Self-pay

## 2021-01-27 ENCOUNTER — Other Ambulatory Visit (INDEPENDENT_AMBULATORY_CARE_PROVIDER_SITE_OTHER): Payer: BC Managed Care – PPO

## 2021-01-27 ENCOUNTER — Ambulatory Visit: Payer: BC Managed Care – PPO | Admitting: Gastroenterology

## 2021-01-27 ENCOUNTER — Encounter: Payer: Self-pay | Admitting: Gastroenterology

## 2021-01-27 VITALS — BP 100/60 | HR 100 | Ht 62.0 in | Wt 133.0 lb

## 2021-01-27 DIAGNOSIS — K50819 Crohn's disease of both small and large intestine with unspecified complications: Secondary | ICD-10-CM | POA: Diagnosis not present

## 2021-01-27 LAB — COMPREHENSIVE METABOLIC PANEL
ALT: 6 U/L (ref 0–35)
AST: 13 U/L (ref 0–37)
Albumin: 4.1 g/dL (ref 3.5–5.2)
Alkaline Phosphatase: 36 U/L — ABNORMAL LOW (ref 39–117)
BUN: 15 mg/dL (ref 6–23)
CO2: 28 mEq/L (ref 19–32)
Calcium: 9.7 mg/dL (ref 8.4–10.5)
Chloride: 105 mEq/L (ref 96–112)
Creatinine, Ser: 1.02 mg/dL (ref 0.40–1.20)
GFR: 66.61 mL/min (ref >60.00)
Glucose, Bld: 102 mg/dL — ABNORMAL HIGH (ref 70–99)
Potassium: 4.7 mEq/L (ref 3.5–5.1)
Sodium: 139 mEq/L (ref 135–145)
Total Bilirubin: 0.3 mg/dL (ref 0.2–1.2)
Total Protein: 7.3 g/dL (ref 6.0–8.3)

## 2021-01-27 LAB — CBC
HCT: 37.3 % (ref 36.0–46.0)
Hemoglobin: 12.2 g/dL (ref 12.0–15.0)
MCHC: 32.6 g/dL (ref 30.0–36.0)
MCV: 84.8 fl (ref 78.0–100.0)
Platelets: 264 10*3/uL (ref 150.0–400.0)
RBC: 4.41 Mil/uL (ref 3.87–5.11)
RDW: 15.8 % — ABNORMAL HIGH (ref 11.5–15.5)
WBC: 7.3 10*3/uL (ref 4.0–10.5)

## 2021-01-27 NOTE — Progress Notes (Signed)
Review of pertinent gastrointestinal problems: 1. Crohn's ileocolitis; diagnosed 2012 elsewhere.  Establish care with me October 2019.  She believes she started Remicade around 2013 colonoscopy February 2014 [place] found deep ulcer at the [facility].  Also "erythema and innumerable deep large serpentine ulcers from the sigmoid through the transverse colon.  Few aphthous ulcers in the a sending and cecum.  Few aphthous ulcers in the terminal ileum just inside the IC valve.  Path showed active inflammation in the terminal ileum and right colon, severe active inflammation of the transverse, focal active inflammation in the rectum.  Negative for granulomas throughout however the pathologist commented that the features were suggestive of IBD, Crohn's disease EGD February 2014 [facility] normal except for mild erythema and erosions in the gastric body. Colonoscopy December 2015 done for "follow-up of Crohn's disease".showed "multiple scars in the transverse colon, splenic flexure, descending colon, and sigmoid colon.  Terminal ileum was not intubated.   HPI: This is a very pleasant 45 year old woman  I last saw her a little over a year ago.  She was doing quite well at that time.  She has a single fairly loose stool every morning other than that she does not move her bowels.  She was on Remicade 5 mg/kg grams every 8 weeks.  Since she was having intermittent loose stools it was not clear if she was having minor flare or minor inflammation despite her Remicade and so we arranged for some blood work including normal CRP, normal sed rate.  Complete metabolic profile was essentially normal.  Her CBC was normal.  Her trough drug level was 24 and she had no circulating antiinfliximab antibodies.  She has continued Remicade 5 mg/kg every 8 weeks and is doing quite well.  She has her usual somewhat loose stool every morning and that is her only bowel movement the day it is never  bloody.  She does not have any abdominal pains.  She has no significant troubles with nausea or vomiting.   She is not completely satisfied with her infusion center, she would like the hours to be a bit more easy for her.   ROS: complete GI ROS as described in HPI, all other review negative.  Constitutional:  No unintentional weight loss   Past Medical History:  Diagnosis Date   Crohn disease ([place])    Kidney stones     Past Surgical History:  Procedure Laterality Date   CARPAL TUNNEL RELEASE Right    KNOT EXCISION Left    forehead above eye, age 20   URETHRA SURGERY      Current Outpatient Medications  Medication Sig Dispense Refill   fluticasone (FLONASE) 50 MCG/ACT nasal spray Place 1 spray into both nostrils daily. 11.1 g 11   inFLIXimab (REMICADE) 100 MG injection Inject into the vein every 8 (eight) weeks.     pantoprazole (PROTONIX) 40 MG tablet TAKE 1 TABLET BY MOUTH EVERY DAY (Patient taking differently: Take 40 mg by mouth daily as needed.) 90 tablet 0   No current facility-administered medications for this visit.    Allergies as of 01/27/2021   (No Known Allergies)    Family History  Problem Relation Age of Onset   Breast cancer Mother    Heart disease Father    Skin cancer Father    Hyperlipidemia Brother    Breast cancer Paternal Grandmother     Social History   Socioeconomic History   Marital status: Single    Spouse  name: Not on file   Number of children: 0   Years of education: Not on file   Highest education level: Not on file  Occupational History   Occupation: research and development  Tobacco Use   Smoking status: Every Day    Packs/day: 1.00    Years: 26.00    Pack years: 26.00    Types: Cigarettes   Smokeless tobacco: Never  Vaping Use   Vaping Use: Never used  Substance and Sexual Activity   Alcohol use: Yes    Comment: wine on special occasions   Drug use: Yes    Types: Marijuana    Comment: OCCASSIONAL   Sexual activity:  Not Currently  Other Topics Concern   Not on file  Social History Narrative   Not on file   Social Determinants of Health   Financial Resource Strain: Not on file  Food Insecurity: Not on file  Transportation Needs: Not on file  Physical Activity: Not on file  Stress: Not on file  Social Connections: Not on file  Intimate Partner Violence: Not on file     Physical Exam: BP 100/60   Pulse 100   Ht 5' 2"  (1.575 m)   Wt 133 lb (60.3 kg)   BMI 24.33 kg/m  Constitutional: generally well-appearing Psychiatric: alert and oriented x3 Abdomen: soft, nontender, nondistended, no obvious ascites, no peritoneal signs, normal bowel sounds No peripheral edema noted in lower extremities  Assessment and plan: 45 y.o. female with Crohn's ileocolitis  She is doing quite well on maintenance Remicade 5 mg/kg every 8 weeks.  She is interested in exploring other infusion centers because the 1 she has had does not have perfectly ideal hours for her.  We will see about getting her into the [facility] infusion center on [address] for her November 8 dose.  She will continue plans for her September 21 dose at her current infusion center.  Stage repeat labs today including CBC, complete metabolic profile and TB quant gold.  She will return to see me in 1 year and sooner if any problems.  Please see the "Patient Instructions" section for addition details about the plan.  Owens Loffler, MD [facility] 01/27/2021, 4:01 PM   Total time on date of encounter was 30 minutes (this included time spent preparing to see the patient reviewing records; obtaining and/or reviewing separately obtained history; performing a medically appropriate exam and/or evaluation; counseling and educating the patient and family if present; ordering medications, tests or procedures if applicable; and documenting clinical information in the health record).

## 2021-01-27 NOTE — Patient Instructions (Signed)
If you are age 45 or younger, your body mass index should be between 19-25. Your Body mass index is 24.33 kg/m. If this is out of the aformentioned range listed, please consider follow up with your Primary Care Provider.   __________________________________________________________  The [place] [facility] providers would like to encourage you to use [facility] to communicate with providers for non-urgent requests or questions.  Due to long hold times on the telephone, sending your provider a message by [facility] may be a faster and more efficient way to get a response.  Please allow 48 business hours for a response.  Please remember that this is for non-urgent requests.   Your provider has requested that you go to the basement level for lab work before leaving today. Press "B" on the elevator. The lab is located at the first door on the left as you exit the elevator.  Due to recent changes in healthcare laws, you may see the results of your imaging and laboratory studies on MyChart before your provider has had a chance to review them.  We understand that in some cases there may be results that are confusing or concerning to you. Not all laboratory results come back in the same time frame and the provider may be waiting for multiple results in order to interpret others.  Please give [place] 48 hours in order for your provider to thoroughly review all the results before contacting the office for clarification of your results.   We will work on getting you scheduled with the infusion center for the week of November 8th.    We will follow up with you in 1 year.  We will contact you to schedule this appointment.  Thank you for entrusting me with your care and choosing [facility].  Dr Ardis Hughs

## 2021-01-27 NOTE — Telephone Encounter (Addendum)
Dr Ardis Hughs saw this patient today in clinic.  She would like to begin getting her Remicade infusions at the [facility] location.  She is scheduled to have an infusion next week, but going forward she would like to have infusions at [facility] location.  This should be around the week of Nov 8th.  Remicade 75m/kg q 8 weeks

## 2021-01-28 ENCOUNTER — Encounter: Payer: Self-pay | Admitting: Gastroenterology

## 2021-01-28 NOTE — Telephone Encounter (Signed)
The order has been entered for Amanda Klein Remicade 5 mg/Kg every 8 weeks.  That office will call the pt with an appt.

## 2021-01-28 NOTE — Telephone Encounter (Signed)
Dr Ardis Hughs are you referring to the [facility] on [place]?

## 2021-01-30 LAB — QUANTIFERON-TB GOLD PLUS
Mitogen-NIL: >10 [IU]/mL
NIL: 0.03 [IU]/mL
QuantiFERON-TB Gold Plus: NEGATIVE
TB1-NIL: 0 [IU]/mL
TB2-NIL: 0 [IU]/mL

## 2021-02-04 DIAGNOSIS — K5090 Crohn's disease, unspecified, without complications: Secondary | ICD-10-CM | POA: Diagnosis not present

## 2021-02-09 ENCOUNTER — Other Ambulatory Visit: Payer: Self-pay | Admitting: Pharmacy Technician

## 2021-02-09 ENCOUNTER — Telehealth: Payer: Self-pay | Admitting: Pharmacy Technician

## 2021-02-09 NOTE — Telephone Encounter (Signed)
Dr. Ardis Hughs,  Josem Kaufmann Submission: REMICADE = APPROVED Payer: [employer] Medication & CPT/J Code(s) submitted: Remicade (Infliximab) J1745 Route of submission (phone, fax, portal): COVER MY MEDS Auth type: Buy/Bill Units/visits requested: 210 UNITS Reference number: [id number]   (Scheduling team aware)

## 2021-02-14 DIAGNOSIS — Z720 Tobacco use: Secondary | ICD-10-CM | POA: Diagnosis not present

## 2021-02-18 ENCOUNTER — Other Ambulatory Visit: Payer: Self-pay | Admitting: Pharmacy Technician

## 2021-02-18 ENCOUNTER — Telehealth: Payer: Self-pay | Admitting: Pharmacy Technician

## 2021-02-18 NOTE — Telephone Encounter (Signed)
(  Fyi note)  Received updated approval letter from [place]: 02/05/21 - 02/11/21. PA: [id number]  Per [place] patient has decided to switch sites. New PA on file for another site.  Could not leave v/m due to mail-box was full. Will attempt to reach out to patient again to confirm change of site. Will f/u with response.

## 2021-03-03 DIAGNOSIS — H5213 Myopia, bilateral: Secondary | ICD-10-CM | POA: Diagnosis not present

## 2021-04-01 ENCOUNTER — Ambulatory Visit: Payer: BC Managed Care – PPO

## 2021-04-08 DIAGNOSIS — K5090 Crohn's disease, unspecified, without complications: Secondary | ICD-10-CM | POA: Diagnosis not present

## 2021-05-25 DIAGNOSIS — Z79899 Other long term (current) drug therapy: Secondary | ICD-10-CM | POA: Diagnosis not present

## 2021-05-25 DIAGNOSIS — R946 Abnormal results of thyroid function studies: Secondary | ICD-10-CM | POA: Diagnosis not present

## 2021-05-25 DIAGNOSIS — R7309 Other abnormal glucose: Secondary | ICD-10-CM | POA: Diagnosis not present

## 2021-05-25 DIAGNOSIS — Z0000 Encounter for general adult medical examination without abnormal findings: Secondary | ICD-10-CM | POA: Diagnosis not present

## 2021-06-01 DIAGNOSIS — R7309 Other abnormal glucose: Secondary | ICD-10-CM | POA: Diagnosis not present

## 2021-06-01 DIAGNOSIS — Z0000 Encounter for general adult medical examination without abnormal findings: Secondary | ICD-10-CM | POA: Diagnosis not present

## 2021-06-01 DIAGNOSIS — Z124 Encounter for screening for malignant neoplasm of cervix: Secondary | ICD-10-CM | POA: Diagnosis not present

## 2021-06-01 DIAGNOSIS — Z1151 Encounter for screening for human papillomavirus (HPV): Secondary | ICD-10-CM | POA: Diagnosis not present

## 2021-06-01 DIAGNOSIS — R946 Abnormal results of thyroid function studies: Secondary | ICD-10-CM | POA: Diagnosis not present

## 2021-06-03 DIAGNOSIS — K5090 Crohn's disease, unspecified, without complications: Secondary | ICD-10-CM | POA: Diagnosis not present

## 2021-06-11 DIAGNOSIS — Z79899 Other long term (current) drug therapy: Secondary | ICD-10-CM | POA: Diagnosis not present

## 2021-06-11 DIAGNOSIS — R5383 Other fatigue: Secondary | ICD-10-CM | POA: Diagnosis not present

## 2021-06-11 DIAGNOSIS — M8580 Other specified disorders of bone density and structure, unspecified site: Secondary | ICD-10-CM | POA: Diagnosis not present

## 2021-06-11 DIAGNOSIS — Z13820 Encounter for screening for osteoporosis: Secondary | ICD-10-CM | POA: Diagnosis not present

## 2021-06-11 DIAGNOSIS — K5090 Crohn's disease, unspecified, without complications: Secondary | ICD-10-CM | POA: Diagnosis not present

## 2021-06-15 ENCOUNTER — Other Ambulatory Visit: Payer: Self-pay | Admitting: Family Medicine

## 2021-06-15 DIAGNOSIS — Z803 Family history of malignant neoplasm of breast: Secondary | ICD-10-CM

## 2021-06-15 DIAGNOSIS — N6019 Diffuse cystic mastopathy of unspecified breast: Secondary | ICD-10-CM

## 2021-07-01 ENCOUNTER — Ambulatory Visit
Admission: RE | Admit: 2021-07-01 | Discharge: 2021-07-01 | Disposition: A | Discharge status: Home / Self Care | Payer: BC Managed Care – PPO | Source: Ambulatory Visit | Attending: Family Medicine | Admitting: Family Medicine

## 2021-07-01 ENCOUNTER — Ambulatory Visit: Payer: BC Managed Care – PPO

## 2021-07-01 ENCOUNTER — Other Ambulatory Visit: Payer: Self-pay

## 2021-07-01 DIAGNOSIS — N6019 Diffuse cystic mastopathy of unspecified breast: Secondary | ICD-10-CM

## 2021-07-01 DIAGNOSIS — R922 Inconclusive mammogram: Secondary | ICD-10-CM | POA: Diagnosis not present

## 2021-07-01 DIAGNOSIS — Z803 Family history of malignant neoplasm of breast: Secondary | ICD-10-CM

## 2021-07-29 DIAGNOSIS — N390 Urinary tract infection, site not specified: Secondary | ICD-10-CM | POA: Diagnosis not present

## 2021-07-29 DIAGNOSIS — R300 Dysuria: Secondary | ICD-10-CM | POA: Diagnosis not present

## 2021-07-30 DIAGNOSIS — Z87442 Personal history of urinary calculi: Secondary | ICD-10-CM | POA: Diagnosis not present

## 2021-07-30 DIAGNOSIS — R1010 Upper abdominal pain, unspecified: Secondary | ICD-10-CM | POA: Diagnosis not present

## 2021-08-06 DIAGNOSIS — K5090 Crohn's disease, unspecified, without complications: Secondary | ICD-10-CM | POA: Diagnosis not present

## 2021-09-07 DIAGNOSIS — N200 Calculus of kidney: Secondary | ICD-10-CM | POA: Diagnosis not present

## 2021-09-27 DIAGNOSIS — J069 Acute upper respiratory infection, unspecified: Secondary | ICD-10-CM | POA: Diagnosis not present

## 2021-09-27 DIAGNOSIS — H6503 Acute serous otitis media, bilateral: Secondary | ICD-10-CM | POA: Diagnosis not present

## 2021-09-30 ENCOUNTER — Telehealth: Payer: Self-pay | Admitting: Gastroenterology

## 2021-09-30 NOTE — Telephone Encounter (Signed)
Patient called to inform you that the infusions she's been getting at [facility] have changed from Remicade to [place].  Since starting the Avsola, she's had a kidney stone, sinus infection, and other ailments she never had prior to the medication being switched.  Also since having the kidney stone, she had a CT scan which shows one of her kidneys as necrotic, so she only has one kidney now.  She wants to know your thoughts and suggestions.  Thank you. ?

## 2021-09-30 NOTE — Telephone Encounter (Signed)
Returned call to patient. I informed her of Dr. Ardis Hughs recommendations. Pt told me that she "does not care what we say because it is not the same and her body is reacting to it". I informed her that Enrique Sack is a biosimilar to Remicade and she can discuss further at her appt. Pt has been scheduled for a follow up appt with Dr. Ardis Hughs on Monday, 10/05/21 at 3:40 pm. Pt verbalized understanding and had no concerns at the end of the call. ?

## 2021-10-05 ENCOUNTER — Ambulatory Visit: Payer: BC Managed Care – PPO | Admitting: Gastroenterology

## 2021-10-05 ENCOUNTER — Encounter: Payer: Self-pay | Admitting: Gastroenterology

## 2021-10-05 ENCOUNTER — Other Ambulatory Visit (INDEPENDENT_AMBULATORY_CARE_PROVIDER_SITE_OTHER): Payer: BC Managed Care – PPO

## 2021-10-05 VITALS — BP 108/66 | HR 68 | Wt 131.0 lb

## 2021-10-05 DIAGNOSIS — K5090 Crohn's disease, unspecified, without complications: Secondary | ICD-10-CM | POA: Diagnosis not present

## 2021-10-05 DIAGNOSIS — K50819 Crohn's disease of both small and large intestine with unspecified complications: Secondary | ICD-10-CM | POA: Diagnosis not present

## 2021-10-05 DIAGNOSIS — Z79899 Other long term (current) drug therapy: Secondary | ICD-10-CM | POA: Diagnosis not present

## 2021-10-05 LAB — COMPREHENSIVE METABOLIC PANEL
ALT: 7 U/L (ref 0–35)
AST: 15 U/L (ref 0–37)
Albumin: 4.1 g/dL (ref 3.5–5.2)
Alkaline Phosphatase: 51 U/L (ref 39–117)
BUN: 9 mg/dL (ref 6–23)
CO2: 30 mEq/L (ref 19–32)
Calcium: 9.4 mg/dL (ref 8.4–10.5)
Chloride: 101 mEq/L (ref 96–112)
Creatinine, Ser: 1 mg/dL (ref 0.40–1.20)
GFR: 67.88 mL/min (ref >60.00)
Glucose, Bld: 90 mg/dL (ref 70–99)
Potassium: 4.2 mEq/L (ref 3.5–5.1)
Sodium: 136 mEq/L (ref 135–145)
Total Bilirubin: 0.3 mg/dL (ref 0.2–1.2)
Total Protein: 7.7 g/dL (ref 6.0–8.3)

## 2021-10-05 LAB — CBC
HCT: 39.6 % (ref 36.0–46.0)
Hemoglobin: 13.2 g/dL (ref 12.0–15.0)
MCHC: 33.2 g/dL (ref 30.0–36.0)
MCV: 85.4 fl (ref 78.0–100.0)
Platelets: 364 10*3/uL (ref 150.0–400.0)
RBC: 4.64 Mil/uL (ref 3.87–5.11)
RDW: 15.3 % (ref 11.5–15.5)
WBC: 8.4 10*3/uL (ref 4.0–10.5)

## 2021-10-05 LAB — SEDIMENTATION RATE: Sed Rate: 27 mm/hr — ABNORMAL HIGH (ref 0–20)

## 2021-10-05 LAB — C-REACTIVE PROTEIN: CRP: <1 mg/dL (ref 0.5–20.0)

## 2021-10-05 NOTE — Progress Notes (Unsigned)
Review of pertinent gastrointestinal problems: 1. Crohn's ileocolitis; diagnosed 2012 elsewhere.  Establish care with me October 2019.  She believes she started Remicade around 2013 colonoscopy February 2014 [place] found deep ulcer at the [facility].  Also "erythema and innumerable deep large serpentine ulcers from the sigmoid through the transverse colon.  Few aphthous ulcers in the a sending and cecum.  Few aphthous ulcers in the terminal ileum just inside the IC valve.  Path showed active inflammation in the terminal ileum and right colon, severe active inflammation of the transverse, focal active inflammation in the rectum.  Negative for granulomas throughout however the pathologist commented that the features were suggestive of IBD, Crohn's disease EGD February 2014 [facility] normal except for mild erythema and erosions in the gastric body. Colonoscopy December 2015 done for "follow-up of Crohn's disease".showed "multiple scars in the transverse colon, splenic flexure, descending colon, and sigmoid colon.  Terminal ileum was not intubated. Remicade 5 mg/kg every 8 weeks. Infliximab antibodies NEGATIVE 11/2019 Infliximab concentration 24 11/2019 TB quant gold - 01/2021   HPI: This is a very pleasant 46 year old woman  I last saw her here in the office September 2022.  She was doing very well clinically.  She was not satisfied with her infusion center so she asked about changing.  She never went through that change.  Starting in January her infusion was however changed from Remicade brand infliximab to Avsola brand infliximab.  Since then she has had kidney stones and chronic sinusitis diagnosed.  She has also noticed crampy abdominal discomforts.  She used to move her bowels every single day and since changing to a solo she only goes every 2 to 3 days.  She would really like to change back to Remicade brand infliximab  ROS: complete GI ROS as described in HPI, all  other review negative.  Constitutional:  No unintentional weight loss   Past Medical History:  Diagnosis Date   Crohn disease ([place])    Kidney stones     Past Surgical History:  Procedure Laterality Date   CARPAL TUNNEL RELEASE Right    KNOT EXCISION Left    forehead above eye, age 71   URETHRA SURGERY      Current Outpatient Medications  Medication Instructions   fluticasone (FLONASE) 50 MCG/ACT nasal spray 1 spray, Each Nare, Daily   inFLIXimab (REMICADE) 100 MG injection Every 8 weeks   pantoprazole (PROTONIX) 40 MG tablet TAKE 1 TABLET BY MOUTH EVERY DAY    Allergies as of 10/05/2021   (No Known Allergies)    Family History  Problem Relation Age of Onset   Breast cancer Mother    Heart disease Father    Skin cancer Father    Hyperlipidemia Brother    Breast cancer Paternal Grandmother     Social History   Socioeconomic History   Marital status: Single    Spouse name: Not on file   Number of children: 0   Years of education: Not on file   Highest education level: Not on file  Occupational History   Occupation: research and development  Tobacco Use   Smoking status: Every Day    Packs/day: 1.00    Years: 26.00    Pack years: 26.00    Types: Cigarettes   Smokeless tobacco: Never  Vaping Use   Vaping Use: Never used  Substance and Sexual Activity   Alcohol use: Yes    Comment: wine on special occasions   Drug use:  Yes    Types: Marijuana    Comment: OCCASSIONAL   Sexual activity: Not Currently  Other Topics Concern   Not on file  Social History Narrative   Not on file   Social Determinants of Health   Financial Resource Strain: Not on file  Food Insecurity: Not on file  Transportation Needs: Not on file  Physical Activity: Not on file  Stress: Not on file  Social Connections: Not on file  Intimate Partner Violence: Not on file     Physical Exam: BP 108/66   Pulse 68   Wt 131 lb (59.4 kg)   BMI 23.96 kg/m  Constitutional: generally  well-appearing Psychiatric: alert and oriented x3 Abdomen: soft, nontender, nondistended, no obvious ascites, no peritoneal signs, normal bowel sounds No peripheral edema noted in lower extremities  Assessment and plan: 46 y.o. female with Crohn's ileocolitis  She has noticed a few things since changing from Remicade brand infliximab to Avsola brand infliximab 3 to 4 months.  Kidney stones, chronic sinusitis and some mild bowel changes.  No serious abdominal pains, no overt bleeding, no terrible diarrhea.  It is not clear to me if this change in infliximab formulations has led to her problems.  Not really sure even if she is having a flare of her Crohn's.  I think will be helpful to get infliximab drug concentration and antibody levels checked.  Fortunately her next infusion is tomorrow.  She will also get CBC, complete metabolic profile, sedimentation rate and CRP.  Pending those results I will likely reach out to her infusion center and see about her changing back from Avsola to Remicade if it is possible.  She tells me that her insurance company has no problem with that.  Please see the "Patient Instructions" section for addition details about the plan.  Amanda Loffler, MD [facility] 10/05/2021, 4:03 PM   Total time on date of encounter was 30 minutes (this included time spent preparing to see the patient reviewing records; obtaining and/or reviewing separately obtained history; performing a medically appropriate exam and/or evaluation; counseling and educating the patient and family if present; ordering medications, tests or procedures if applicable; and documenting clinical information in the health record).

## 2021-10-05 NOTE — Patient Instructions (Signed)
If you are age 46 or younger, your body mass index should be between 19-25. Your Body mass index is 23.96 kg/m. If this is out of the aformentioned range listed, please consider follow up with your Primary Care Provider.  ________________________________________________________  The [place] [facility] providers would like to encourage you to use [facility] to communicate with providers for non-urgent requests or questions.  Due to long hold times on the telephone, sending your provider a message by [facility] may be a faster and more efficient way to get a response.  Please allow 48 business hours for a response.  Please remember that this is for non-urgent requests.  _______________________________________________________  Your provider has requested that you go to the basement level for lab work before leaving today. Press "B" on the elevator. The lab is located at the first door on the left as you exit the elevator.  Due to recent changes in healthcare laws, you may see the results of your imaging and laboratory studies on MyChart before your provider has had a chance to review them.  We understand that in some cases there may be results that are confusing or concerning to you. Not all laboratory results come back in the same time frame and the provider may be waiting for multiple results in order to interpret others.  Please give [place] 48 hours in order for your provider to thoroughly review all the results before contacting the office for clarification of your results.   Please start taking citrucel (orange flavored) powder fiber supplement.  This may cause some bloating at first but that usually goes away. Begin with a small spoonful and work your way up to a large, heaping spoonful daily over a week.  Thank you for entrusting me with your care and choosing [facility].  Dr Ardis Hughs

## 2021-10-07 ENCOUNTER — Telehealth: Payer: Self-pay | Admitting: Gastroenterology

## 2021-10-07 DIAGNOSIS — K5090 Crohn's disease, unspecified, without complications: Secondary | ICD-10-CM | POA: Diagnosis not present

## 2021-10-07 NOTE — Telephone Encounter (Signed)
April from [facility] calling in regards to mutual patient. Please give her a call back at her direct number [phone].  Thank you.

## 2021-10-07 NOTE — Telephone Encounter (Signed)
I spoke with April and she wanted to confirm that the pt would be switching back to Remicade.  I did advised that Dr Ardis Hughs will be making that determination after he reviews the recent lab results.  April has asked that I call her direct number when I have more information.

## 2021-10-12 LAB — SERIAL MONITORING

## 2021-10-13 LAB — INFLIXIMAB+AB (SERIAL MONITOR)
Anti-Infliximab Antibody: <22 ng/mL
Infliximab Drug Level: 4.5 ug/mL

## 2021-10-15 LAB — INFLIXIMAB LEVEL FOR IBD: Infliximab Level, IBD: 3.6 ug/mL

## 2021-10-21 DIAGNOSIS — F17200 Nicotine dependence, unspecified, uncomplicated: Secondary | ICD-10-CM | POA: Diagnosis not present

## 2021-10-21 DIAGNOSIS — J3089 Other allergic rhinitis: Secondary | ICD-10-CM | POA: Diagnosis not present

## 2021-10-21 DIAGNOSIS — H68002 Unspecified Eustachian salpingitis, left ear: Secondary | ICD-10-CM | POA: Diagnosis not present

## 2021-11-26 ENCOUNTER — Telehealth: Payer: Self-pay

## 2021-11-26 NOTE — Telephone Encounter (Signed)
Dr [facility] called and state that the insurance denied Remicade.  They are submitting an appeal but need a letter of medical necessity from you.  Ok to complete or do you want to proceed differently?

## 2021-11-27 NOTE — Telephone Encounter (Signed)
I have written the letter and sent to April at [facility].  She will send office notes and labs as well as procedures.

## 2021-11-30 ENCOUNTER — Telehealth: Payer: Self-pay | Admitting: Gastroenterology

## 2021-11-30 NOTE — Telephone Encounter (Signed)
The letter has been resent to Amanda Klein as requested

## 2021-11-30 NOTE — Telephone Encounter (Signed)
April from [facility] called this morning to say she did not receive the fax you sent her on Friday.  She would like you to try a different fax, [phone], and put it to her attention -- April S.  Thank you.

## 2021-12-01 NOTE — Telephone Encounter (Signed)
Letter has been faxed again x 3 to the fax number provided

## 2021-12-01 NOTE — Telephone Encounter (Signed)
April called back and stated that she still did not receive the fax for patient. Requesting it be sent to [phone]  attention April S.

## 2021-12-11 DIAGNOSIS — K5090 Crohn's disease, unspecified, without complications: Secondary | ICD-10-CM | POA: Diagnosis not present

## 2022-02-10 DIAGNOSIS — K5090 Crohn's disease, unspecified, without complications: Secondary | ICD-10-CM | POA: Diagnosis not present

## 2022-02-22 ENCOUNTER — Ambulatory Visit: Payer: BC Managed Care – PPO | Admitting: Gastroenterology

## 2022-03-04 DIAGNOSIS — H52223 Regular astigmatism, bilateral: Secondary | ICD-10-CM | POA: Diagnosis not present

## 2022-03-04 DIAGNOSIS — Z135 Encounter for screening for eye and ear disorders: Secondary | ICD-10-CM | POA: Diagnosis not present

## 2022-03-04 DIAGNOSIS — H5213 Myopia, bilateral: Secondary | ICD-10-CM | POA: Diagnosis not present

## 2022-03-04 DIAGNOSIS — H524 Presbyopia: Secondary | ICD-10-CM | POA: Diagnosis not present

## 2022-03-26 ENCOUNTER — Ambulatory Visit: Payer: BC Managed Care – PPO | Admitting: Physician Assistant

## 2022-04-07 DIAGNOSIS — K5090 Crohn's disease, unspecified, without complications: Secondary | ICD-10-CM | POA: Diagnosis not present

## 2022-05-05 ENCOUNTER — Ambulatory Visit: Payer: BC Managed Care – PPO | Admitting: Gastroenterology

## 2022-05-31 DIAGNOSIS — R7309 Other abnormal glucose: Secondary | ICD-10-CM | POA: Diagnosis not present

## 2022-05-31 DIAGNOSIS — Z13220 Encounter for screening for lipoid disorders: Secondary | ICD-10-CM | POA: Diagnosis not present

## 2022-05-31 DIAGNOSIS — Z79899 Other long term (current) drug therapy: Secondary | ICD-10-CM | POA: Diagnosis not present

## 2022-05-31 DIAGNOSIS — R946 Abnormal results of thyroid function studies: Secondary | ICD-10-CM | POA: Diagnosis not present

## 2022-05-31 DIAGNOSIS — E559 Vitamin D deficiency, unspecified: Secondary | ICD-10-CM | POA: Diagnosis not present

## 2022-06-02 ENCOUNTER — Ambulatory Visit: Payer: BC Managed Care – PPO | Admitting: Gastroenterology

## 2022-06-02 DIAGNOSIS — K5090 Crohn's disease, unspecified, without complications: Secondary | ICD-10-CM | POA: Diagnosis not present

## 2022-06-07 DIAGNOSIS — Z87442 Personal history of urinary calculi: Secondary | ICD-10-CM | POA: Diagnosis not present

## 2022-06-07 DIAGNOSIS — Z0000 Encounter for general adult medical examination without abnormal findings: Secondary | ICD-10-CM | POA: Diagnosis not present

## 2022-06-07 DIAGNOSIS — K5090 Crohn's disease, unspecified, without complications: Secondary | ICD-10-CM | POA: Diagnosis not present

## 2022-06-07 DIAGNOSIS — J3089 Other allergic rhinitis: Secondary | ICD-10-CM | POA: Diagnosis not present

## 2022-06-16 ENCOUNTER — Ambulatory Visit: Payer: BC Managed Care – PPO | Admitting: Gastroenterology

## 2022-06-16 ENCOUNTER — Encounter: Payer: Self-pay | Admitting: Gastroenterology

## 2022-06-16 VITALS — BP 118/78 | HR 98 | Ht 62.0 in | Wt 145.0 lb

## 2022-06-16 DIAGNOSIS — K5090 Crohn's disease, unspecified, without complications: Secondary | ICD-10-CM | POA: Diagnosis not present

## 2022-06-16 NOTE — Progress Notes (Signed)
06/16/2022 Amanda Klein [id number] 13-Nov-1975  Review of pertinent gastrointestinal problems: 1. Crohn's ileocolitis; diagnosed 2012 elsewhere.  Establish care with me October 2019.  She believes she started Remicade around 2013 colonoscopy February 2014 [place] found deep ulcer at the [facility].  Also "erythema and innumerable deep large serpentine ulcers from the sigmoid through the transverse colon.  Few aphthous ulcers in the a sending and cecum.  Few aphthous ulcers in the terminal ileum just inside the IC valve.  Path showed active inflammation in the terminal ileum and right colon, severe active inflammation of the transverse, focal active inflammation in the rectum.  Negative for granulomas throughout however the pathologist commented that the features were suggestive of IBD, Crohn's disease EGD February 2014 [facility] normal except for mild erythema and erosions in the gastric body. Colonoscopy December 2015 done for "follow-up of Crohn's disease".showed "multiple scars in the transverse colon, splenic flexure, descending colon, and sigmoid colon.  Terminal ileum was not intubated. Remicade 5 mg/kg every 8 weeks. Infliximab antibodies NEGATIVE 11/2019 Infliximab concentration 24 11/2019 TB quant gold - 01/2021  HISTORY OF PRESENT ILLNESS: This is a 47 year old female who is a patient of Dr. Ardis Hughs.  She switched back from Avsola to Remicade around May 2023 shortly after she was seen here last.  Her infliximab concentration levels were on the lower side and she felt like she did not do as well with the Avsola.  Since switching back to the Remicade she is doing well on 5 mg/kg every 8 weeks.  She says that she has 1 formed bowel movement after drinking coffee in the morning.  No rectal bleeding.  No abdominal pain.  Her next infusion is 07/28/22.  Past Medical History:  Diagnosis Date   Crohn disease ([place])    Kidney stones    Past Surgical History:   Procedure Laterality Date   CARPAL TUNNEL RELEASE Right    KNOT EXCISION Left    forehead above eye, age 75   URETHRA SURGERY      reports that she has been smoking cigarettes. She has a 26.00 pack-year smoking history. She has never used smokeless tobacco. She reports current alcohol use. She reports current drug use. Drug: Marijuana. family history includes Breast cancer in her mother and paternal grandmother; Heart disease in her father; Hyperlipidemia in her brother; Skin cancer in her father. No Known Allergies    Outpatient Encounter Medications as of 06/16/2022  Medication Sig   inFLIXimab (REMICADE) 100 MG injection Inject into the vein every 8 (eight) weeks.   pantoprazole (PROTONIX) 40 MG tablet TAKE 1 TABLET BY MOUTH EVERY DAY (Patient taking differently: Take 40 mg by mouth daily as needed.)   fluticasone (FLONASE) 50 MCG/ACT nasal spray Place 1 spray into both nostrils daily. (Patient not taking: Reported on 06/16/2022)   No facility-administered encounter medications on file as of 06/16/2022.    REVIEW OF SYSTEMS  : All other systems reviewed and negative except where noted in the History of Present Illness.   PHYSICAL EXAM: BP 118/78   Pulse 98   Ht 5\' 2"  (1.575 m)   Wt 145 lb (65.8 kg)   SpO2 97%   BMI 26.52 kg/m  General: Well developed white female in no acute distress Head: Normocephalic and atraumatic Eyes:  Sclerae anicteric, conjunctiva pink. Ears: Normal auditory acuity Lungs: Clear throughout to auscultation; no W/R/R. Heart: Regular rate and rhythm; no M/R/G. Abdomen: Soft, non-distended.  BS present.  Non-tender. Musculoskeletal: Symmetrical with no gross deformities  Skin: No lesions on visible extremities Extremities: No edema  Neurological: Alert oriented x 4, grossly non-focal Psychological:  Alert and cooperative. Normal mood and affect  ASSESSMENT AND PLAN: 47 y.o. female with Crohn's ileocolitis: She is doing well back on the Remicade  brand-name, 5 mg/kg every 8 weeks.  No complaints.  Will check CBC, CMP, sed rate, CRP, and infliximab antibody and concentration levels just prior to her next infusion.  Will check quantiferon gold as well.  She will return for those labs at that time.  CC:  Janie Morning, DO

## 2022-06-16 NOTE — Patient Instructions (Signed)
Your provider has requested that you go to the basement level for lab work before leaving today. Press "B" on the elevator. The lab is located at the first door on the left as you exit the elevator.  Make sure to come back at your convenience before your next infusion   Follow up in 1 year  _______________________________________________________  If your blood pressure at your visit was 140/90 or greater, please contact your primary care physician to follow up on this.  _______________________________________________________  If you are age 47 or older, your body mass index should be between 23-30. Your Body mass index is 26.52 kg/m. If this is out of the aforementioned range listed, please consider follow up with your Primary Care Provider.  If you are age 80 or younger, your body mass index should be between 19-25. Your Body mass index is 26.52 kg/m. If this is out of the aformentioned range listed, please consider follow up with your Primary Care Provider.   ________________________________________________________  The [facility] providers would like to encourage you to use [facility] to communicate with providers for non-urgent requests or questions.  Due to long hold times on the telephone, sending your provider a message by [facility] may be a faster and more efficient way to get a response.  Please allow 48 business hours for a response.  Please remember that this is for non-urgent requests.  _______________________________________________________   Due to recent changes in healthcare laws, you may see the results of your imaging and laboratory studies on MyChart before your provider has had a chance to review them.  We understand that in some cases there may be results that are confusing or concerning to you. Not all laboratory results come back in the same time frame and the provider may be waiting for multiple results in order to interpret others.  Please give [place] 48 hours in order for your  provider to thoroughly review all the results before contacting the office for clarification of your results.    I appreciate the  opportunity to care for you  Thank You   Janett Billow Zehr,PA-C

## 2022-06-16 NOTE — Progress Notes (Signed)
Reviewed and agree with management plans. Appears to be in clinical remission on Remicade. It has now been 12 years since diagnosis and 10 years since her last colonoscopy. Recommend CRP and fecal calprotectin as well as initial screening colonoscopy.  Tylah Mancillas L. Tarri Glenn, MD, MPH

## 2022-06-18 ENCOUNTER — Telehealth: Payer: Self-pay

## 2022-06-18 DIAGNOSIS — K5090 Crohn's disease, unspecified, without complications: Secondary | ICD-10-CM

## 2022-06-18 NOTE — Telephone Encounter (Signed)
-----   Message from Loralie Champagne, PA-C sent at 06/18/2022  4:43 PM EST ----- I saw this patient in clinic the other day.  Please let her know that Dr. Tarri Glenn reviewed her chart and she is suggesting that we go ahead and proceed with colonoscopy now since patient has had Crohn's for over 10 years rather than waiting till December 2025 for her next procedure.  Please schedule her if she is okay with it.  She seemed like she would proceed if we thought necessary.  Dr. Tarri Glenn would also like her to do a stool study called a fecal calprotectin as well.  Thank you,  Jess  ----- Message ----- From: Thornton Park, MD Sent: 06/16/2022   8:28 PM EST To: Loralie Champagne, PA-C     ----- Message ----- From: Loralie Champagne, PA-C Sent: 06/16/2022  12:53 PM EST To: Thornton Park, MD

## 2022-06-21 NOTE — Telephone Encounter (Signed)
Stool test order entered   Left message on machine to call back

## 2022-06-21 NOTE — Telephone Encounter (Signed)
The pt has been advised of the results and colon and previsit has been scheduled. She will also come in for the stool test at her convenience.

## 2022-07-19 ENCOUNTER — Encounter: Payer: Self-pay | Admitting: Gastroenterology

## 2022-07-19 ENCOUNTER — Ambulatory Visit (AMBULATORY_SURGERY_CENTER): Payer: BC Managed Care – PPO

## 2022-07-19 VITALS — Ht 62.0 in | Wt 140.0 lb

## 2022-07-19 DIAGNOSIS — K5090 Crohn's disease, unspecified, without complications: Secondary | ICD-10-CM

## 2022-07-19 MED ORDER — NA SULFATE-K SULFATE-MG SULF 17.5-3.13-1.6 GM/177ML PO SOLN: 1.0000 | Freq: Once | ORAL | 0 refills | Status: AC

## 2022-07-19 MED ORDER — ONDANSETRON HCL 4 MG PO TABS: 4.0000 mg | ORAL_TABLET | ORAL | 0 refills | Status: AC

## 2022-07-19 NOTE — Progress Notes (Signed)

## 2022-07-25 ENCOUNTER — Encounter: Payer: Self-pay | Admitting: Certified Registered Nurse Anesthetist

## 2022-07-26 ENCOUNTER — Other Ambulatory Visit (INDEPENDENT_AMBULATORY_CARE_PROVIDER_SITE_OTHER): Payer: BC Managed Care – PPO

## 2022-07-26 ENCOUNTER — Encounter: Payer: Self-pay | Admitting: Gastroenterology

## 2022-07-26 ENCOUNTER — Ambulatory Visit (AMBULATORY_SURGERY_CENTER): Payer: BC Managed Care – PPO | Admitting: Gastroenterology

## 2022-07-26 VITALS — BP 128/74 | HR 71 | Temp 98.6°F | Resp 11 | Ht 62.0 in | Wt 142.0 lb

## 2022-07-26 DIAGNOSIS — K635 Polyp of colon: Secondary | ICD-10-CM | POA: Diagnosis not present

## 2022-07-26 DIAGNOSIS — K639 Disease of intestine, unspecified: Secondary | ICD-10-CM | POA: Diagnosis not present

## 2022-07-26 DIAGNOSIS — K5090 Crohn's disease, unspecified, without complications: Secondary | ICD-10-CM

## 2022-07-26 DIAGNOSIS — Z1211 Encounter for screening for malignant neoplasm of colon: Secondary | ICD-10-CM | POA: Diagnosis not present

## 2022-07-26 DIAGNOSIS — D125 Benign neoplasm of sigmoid colon: Secondary | ICD-10-CM

## 2022-07-26 DIAGNOSIS — K6389 Other specified diseases of intestine: Secondary | ICD-10-CM | POA: Diagnosis not present

## 2022-07-26 DIAGNOSIS — D120 Benign neoplasm of cecum: Secondary | ICD-10-CM | POA: Diagnosis not present

## 2022-07-26 LAB — COMPREHENSIVE METABOLIC PANEL
ALT: 7 U/L (ref 0–35)
AST: 15 U/L (ref 0–37)
Albumin: 4 g/dL (ref 3.5–5.2)
Alkaline Phosphatase: 32 U/L — ABNORMAL LOW (ref 39–117)
BUN: 9 mg/dL (ref 6–23)
CO2: 22 mEq/L (ref 19–32)
Calcium: 9 mg/dL (ref 8.4–10.5)
Chloride: 105 mEq/L (ref 96–112)
Creatinine, Ser: 1 mg/dL (ref 0.40–1.20)
GFR: 67.5 mL/min (ref >60.00)
Glucose, Bld: 84 mg/dL (ref 70–99)
Potassium: 4.6 mEq/L (ref 3.5–5.1)
Sodium: 136 mEq/L (ref 135–145)
Total Bilirubin: 0.7 mg/dL (ref 0.2–1.2)
Total Protein: 7.5 g/dL (ref 6.0–8.3)

## 2022-07-26 LAB — CBC WITH DIFFERENTIAL/PLATELET
Basophils Absolute: 0.1 10*3/uL (ref 0.0–0.1)
Basophils Relative: 0.4 % (ref 0.0–3.0)
Eosinophils Absolute: 0 10*3/uL (ref 0.0–0.7)
Eosinophils Relative: 0.1 % (ref 0.0–5.0)
HCT: 44.5 % (ref 36.0–46.0)
Hemoglobin: 14.7 g/dL (ref 12.0–15.0)
Lymphocytes Relative: 9.5 % — ABNORMAL LOW (ref 12.0–46.0)
Lymphs Abs: 1.4 10*3/uL (ref 0.7–4.0)
MCHC: 33.1 g/dL (ref 30.0–36.0)
MCV: 89.8 fl (ref 78.0–100.0)
Monocytes Absolute: 1 10*3/uL (ref 0.1–1.0)
Monocytes Relative: 6.9 % (ref 3.0–12.0)
Neutro Abs: 11.9 10*3/uL — ABNORMAL HIGH (ref 1.4–7.7)
Neutrophils Relative %: 83.1 % — ABNORMAL HIGH (ref 43.0–77.0)
Platelets: 265 10*3/uL (ref 150.0–400.0)
RBC: 4.95 Mil/uL (ref 3.87–5.11)
RDW: 13.7 % (ref 11.5–15.5)
WBC: 14.3 10*3/uL — ABNORMAL HIGH (ref 4.0–10.5)

## 2022-07-26 LAB — SEDIMENTATION RATE: Sed Rate: 14 mm/hr (ref 0–20)

## 2022-07-26 LAB — HIGH SENSITIVITY CRP: CRP, High Sensitivity: 0.39 mg/L (ref 0.000–5.000)

## 2022-07-26 MED ORDER — SODIUM CHLORIDE 0.9 % IV SOLN: 500.0000 mL | INTRAVENOUS | Status: DC

## 2022-07-26 NOTE — Progress Notes (Signed)
Report given to PACU, vss 

## 2022-07-26 NOTE — Patient Instructions (Signed)
Handout on polyps given to patient. Await pathology results. Resume previous diet and continue present medications.  Repeat colonoscopy for surveillance will be determined based off of pathology results.   YOU HAD AN ENDOSCOPIC PROCEDURE TODAY AT THE [facility]:   Refer to the procedure report that was given to you for any specific questions about what was found during the examination.  If the procedure report does not answer your questions, please call your gastroenterologist to clarify.  If you requested that your care partner not be given the details of your procedure findings, then the procedure report has been included in a sealed envelope for you to review at your convenience later.  YOU SHOULD EXPECT: Some feelings of bloating in the abdomen. Passage of more gas than usual.  Walking can help get rid of the air that was put into your GI tract during the procedure and reduce the bloating. If you had a lower endoscopy (such as a colonoscopy or flexible sigmoidoscopy) you may notice spotting of blood in your stool or on the toilet paper. If you underwent a bowel prep for your procedure, you may not have a normal bowel movement for a few days.  Please Note:  You might notice some irritation and congestion in your nose or some drainage.  This is from the oxygen used during your procedure.  There is no need for concern and it should clear up in a day or so.  SYMPTOMS TO REPORT IMMEDIATELY:  Following lower endoscopy (colonoscopy or flexible sigmoidoscopy):  Excessive amounts of blood in the stool  Significant tenderness or worsening of abdominal pains  Swelling of the abdomen that is new, acute  Fever of 100F or higher  For urgent or emergent issues, a gastroenterologist can be reached at any hour by calling [phone]. Do not use MyChart messaging for urgent concerns.    DIET:  We do recommend a small meal at first, but then you may proceed to your regular diet.  Drink  plenty of fluids but you should avoid alcoholic beverages for 24 hours.  ACTIVITY:  You should plan to take it easy for the rest of today and you should NOT DRIVE or use heavy machinery until tomorrow (because of the sedation medicines used during the test).    FOLLOW UP: Our staff will call the number listed on your records the next business day following your procedure.  We will call around 7:15- 8:00 am to check on you and address any questions or concerns that you may have regarding the information given to you following your procedure. If we do not reach you, we will leave a message.     If any biopsies were taken you will be contacted by phone or by letter within the next 1-3 weeks.  Please call us at [phone] if you have not heard about the biopsies in 3 weeks.    SIGNATURES/CONFIDENTIALITY: You and/or your care partner have signed paperwork which will be entered into your electronic medical record.  These signatures attest to the fact that that the information above on your After Visit Summary has been reviewed and is understood.  Full responsibility of the confidentiality of this discharge information lies with you and/or your care-partner. 

## 2022-07-26 NOTE — Progress Notes (Signed)
   Referring Provider: Janie Morning, DO Primary Care Physician:  Janie Morning, DO  Indication for Colonoscopy:  Colon cancer screening   IMPRESSION:  Need for colon cancer screening Appropriate candidate for monitored anesthesia care  PLAN: Colonoscopy in the [facility] today   HPI: Amanda Klein is a 47 y.o. female presents for screening colonoscopy. Patient of Dr. Nicholaus Corolla on Remicade for Crohn's ileocolitis.  Last colonoscopy for Crohn's 2015.  No known family history of colon cancer or polyps. No family history of uterine/endometrial cancer, pancreatic cancer or gastric/stomach cancer.   Past Medical History:  Diagnosis Date   Crohn disease ([place])    Kidney stones     Past Surgical History:  Procedure Laterality Date   CARPAL TUNNEL RELEASE Right    KNOT EXCISION Left    forehead above eye, age 53   URETHRA SURGERY      Current Outpatient Medications  Medication Sig Dispense Refill   COLLAGEN PO Collagen     Menaquinone-7 (VITAMIN K2 PO) Vitamin K2     ondansetron (ZOFRAN) 4 MG tablet Take 1 tablet (4 mg total) by mouth as directed. 2 tablet 0   pantoprazole (PROTONIX) 40 MG tablet TAKE 1 TABLET BY MOUTH EVERY DAY (Patient taking differently: Take 40 mg by mouth daily as needed.) 90 tablet 0   VITAMIN D PO Vitamin D     fluticasone (FLONASE) 50 MCG/ACT nasal spray Place 1 spray into both nostrils daily. (Patient not taking: Reported on 06/16/2022) 11.1 g 11   inFLIXimab (REMICADE) 100 MG injection Inject into the vein every 8 (eight) weeks.     Current Facility-Administered Medications  Medication Dose Route Frequency Provider Last Rate Last Admin   0.9 %  sodium chloride infusion  500 mL Intravenous Continuous Thornton Park, MD        Allergies as of 07/26/2022 - Review Complete 07/26/2022  Allergen Reaction Noted   Amoxicillin  05/05/2010   Codeine  05/05/2010   Tramadol  05/05/2010    Family History  Problem Relation Age of Onset   Breast cancer Mother     Heart disease Father    Skin cancer Father    Hyperlipidemia Brother    Breast cancer Paternal Grandmother    Colon cancer Neg Hx    Colon polyps Neg Hx    Esophageal cancer Neg Hx    Rectal cancer Neg Hx    Stomach cancer Neg Hx      Physical Exam: General:   Alert,  well-nourished, pleasant and cooperative in NAD Head:  Normocephalic and atraumatic. Eyes:  Sclera clear, no icterus.   Conjunctiva pink. Mouth:  No deformity or lesions.   Neck:  Supple; no masses or thyromegaly. Lungs:  Clear throughout to auscultation.   No wheezes. Heart:  Regular rate and rhythm; no murmurs. Abdomen:  Soft, non-tender, nondistended, normal bowel sounds, no rebound or guarding.  Msk:  Symmetrical. No boney deformities LAD: No inguinal or umbilical LAD Extremities:  No clubbing or edema. Neurologic:  Alert and  oriented x4;  grossly nonfocal Skin:  No obvious rash or bruise. Psych:  Alert and cooperative. Normal mood and affect.     Studies/Results: No results found.    Brooklin Rieger L. Tarri Glenn, MD, MPH 07/26/2022, 8:20 AM

## 2022-07-26 NOTE — Op Note (Addendum)
[place] Patient Name: Amanda Klein Procedure Date: 07/26/2022 7:53 AM MRN: [id number] Endoscopist: Thornton Park MD, MD, [id number] Age: 47 Referring MD:  Date of Birth: 1975-12-22 Gender: Female Account #: [id number] Procedure:                Colonoscopy Indications:              Screening for colorectal malignant neoplasm                           She has a history of Crohn's ileocolitis in                            remission on Remicade                           Last colonoscopy 10 years ago Medicines:                Monitored Anesthesia Care Procedure:                Pre-Anesthesia Assessment:                           - Prior to the procedure, a History and Physical                            was performed, and patient medications and                            allergies were reviewed. The patient's tolerance of                            previous anesthesia was also reviewed. The risks                            and benefits of the procedure and the sedation                            options and risks were discussed with the patient.                            All questions were answered, and informed consent                            was obtained. Prior Anticoagulants: The patient has                            taken no anticoagulant or antiplatelet agents. ASA                            Grade Assessment: II - A patient with mild systemic                            disease. After reviewing the risks and benefits,  the patient was deemed in satisfactory condition to                            undergo the procedure.                           After obtaining informed consent, the colonoscope                            was passed under direct vision. Throughout the                            procedure, the patient's blood pressure, pulse, and                            oxygen saturations were monitored continuously. The                             Olympus CF-HQ190L SN [id number] was introduced through                            the anus and advanced to the 3 cm into the ileum. A                            second forward view of the right colon was                            performed. The colonoscopy was performed without                            difficulty. The patient tolerated the procedure                            well. The quality of the bowel preparation was                            good. The terminal ileum, ileocecal valve,                            appendiceal orifice, and rectum were photographed. Scope In: 8:24:36 AM Scope Out: 8:45:24 AM Scope Withdrawal Time: 0 hours 14 minutes 14 seconds  Total Procedure Duration: 0 hours 20 minutes 48 seconds  Findings:                 The perianal and digital rectal examinations were                            normal.                           Anal papilla(e) were hypertrophied.                           Scarring was present through the colon. There was  no inflammation seen in the colon. Biopsies were                            taken with cold forceps from the left colon,                            transverse colon, and ascending colon.                           A 2 mm polyp was found in the sigmoid colon. The                            polyp was sessile. The polyp was removed with a                            cold snare. Resection and retrieval were complete.                            Estimated blood loss was minimal.                           A 4 mm polyp was found in the cecum. The polyp was                            flat. The polyp was removed with a cold snare.                            Resection and retrieval were complete. Estimated                            blood loss was minimal.                           The terminal ileum appeared normal. Biopsies were                            obtained from the ileum with cold forceps for                             histology. Estimated blood loss was minimal.                           The exam was otherwise without abnormality on                            direct and retroflexion views. Complications:            No immediate complications. Estimated Blood Loss:     Estimated blood loss was minimal. Impression:               - Anal papilla(e) were hypertrophied.                           - Scarred mucosa in the entire examined colon.                           -  One 2 mm polyp in the sigmoid colon, removed with                            a cold snare. Resected and retrieved.                           - One 4 mm polyp in the cecum, removed with a cold                            snare. Resected and retrieved.                           - The examination was otherwise normal on direct                            and retroflexion views. Recommendation:           - Patient has a contact number available for                            emergencies. The signs and symptoms of potential                            delayed complications were discussed with the                            patient. Return to normal activities tomorrow.                            Written discharge instructions were provided to the                            patient.                           - Resume previous diet.                           - Continue present medications.                           - Await pathology results.                           - Repeat colonoscopy date to be determined after                            pending pathology results are reviewed for                            surveillance.                           - Emerging evidence supports eating a diet of  fruits, vegetables, grains, calcium, and yogurt                            while reducing red meat and alcohol may reduce the                            risk of colon cancer. Thornton Park MD, MD 07/26/2022  8:53:14 AM This report has been signed electronically.

## 2022-07-26 NOTE — Progress Notes (Signed)
Called to room to assist during endoscopic procedure.  Patient ID and intended procedure confirmed with present staff. Received instructions for my participation in the procedure from the performing physician.  

## 2022-07-26 NOTE — Progress Notes (Signed)
Pt's states no medical or surgical changes since previsit or office visit. 

## 2022-07-27 ENCOUNTER — Other Ambulatory Visit: Payer: Self-pay | Admitting: *Deleted

## 2022-07-27 ENCOUNTER — Telehealth: Payer: Self-pay

## 2022-07-27 DIAGNOSIS — K5090 Crohn's disease, unspecified, without complications: Secondary | ICD-10-CM

## 2022-07-27 LAB — TIQ- AMBIGUOUS ORDER

## 2022-07-27 NOTE — Telephone Encounter (Signed)
  Follow up Call-     07/26/2022    7:42 AM  Call back number  Post procedure Call Back phone  # [phone]  Permission to leave phone message Yes     Patient questions:  Do you have a fever, pain , or abdominal swelling? No. Pain Score  0 *  Have you tolerated food without any problems? Yes.    Have you been able to return to your normal activities? Yes.    Do you have any questions about your discharge instructions: Diet   No. Medications  No. Follow up visit  No.  Do you have questions or concerns about your Care? No.  Actions: * If pain score is 4 or above: No action needed, pain <4.

## 2022-07-28 DIAGNOSIS — K5090 Crohn's disease, unspecified, without complications: Secondary | ICD-10-CM | POA: Diagnosis not present

## 2022-07-28 LAB — QUANTIFERON-TB GOLD PLUS
Mitogen-NIL: >10 IU/mL
NIL: 0.03 IU/mL
QuantiFERON-TB Gold Plus: NEGATIVE
TB1-NIL: 0.02 IU/mL
TB2-NIL: 0.01 IU/mL

## 2022-08-03 LAB — INFLIXIMAB LEVEL AND ADA FOR IBD
Infliximab ADA, IBD: <10 AU (ref <10)
Infliximab Level, IBD: 4.4 ug/mL

## 2022-08-03 LAB — TEST AUTHORIZATION

## 2022-08-10 ENCOUNTER — Other Ambulatory Visit: Payer: Self-pay | Admitting: Family Medicine

## 2022-08-10 DIAGNOSIS — Z1231 Encounter for screening mammogram for malignant neoplasm of breast: Secondary | ICD-10-CM

## 2022-09-11 ENCOUNTER — Encounter: Payer: Self-pay | Admitting: Gastroenterology

## 2022-09-14 ENCOUNTER — Telehealth: Payer: Self-pay | Admitting: *Deleted

## 2022-09-14 NOTE — Telephone Encounter (Signed)
-----   Message from Tressia Danas, MD sent at 09/11/2022  9:19 PM EDT ----- Schedule office follow-up with Shanda Bumps in 3-6 months. Thanks.  KLB ----- Message ----- From: Interface, Lab In [facility] Sent: 07/30/2022  11:28 AM EDT To: Tressia Danas, MD

## 2022-09-22 DIAGNOSIS — K5090 Crohn's disease, unspecified, without complications: Secondary | ICD-10-CM | POA: Diagnosis not present

## 2022-09-23 ENCOUNTER — Ambulatory Visit
Admission: RE | Admit: 2022-09-23 | Discharge: 2022-09-23 | Disposition: A | Discharge status: Home / Self Care | Payer: BC Managed Care – PPO | Source: Ambulatory Visit | Attending: Family Medicine | Admitting: Family Medicine

## 2022-09-23 DIAGNOSIS — Z1231 Encounter for screening mammogram for malignant neoplasm of breast: Secondary | ICD-10-CM

## 2022-10-21 IMAGING — MG DIGITAL DIAGNOSTIC BILAT W/ TOMO W/ CAD
6 of 10 series · 6 of 30 positions shown · non-contrast
Comparison: Previous exams.

CLINICAL DATA: 45-year-old female with strong family history of
breast cancer including in her mother having been diagnosed with
breast cancer as well as paternal grandmother. The patient has no
specific complaints today.

EXAM:
DIGITAL DIAGNOSTIC BILATERAL MAMMOGRAM WITH TOMOSYNTHESIS AND CAD
TECHNIQUE: Bilateral digital diagnostic mammography and breast tomosynthesis
was performed. The images were evaluated with computer-aided
detection.

[R MLO synth-2D]
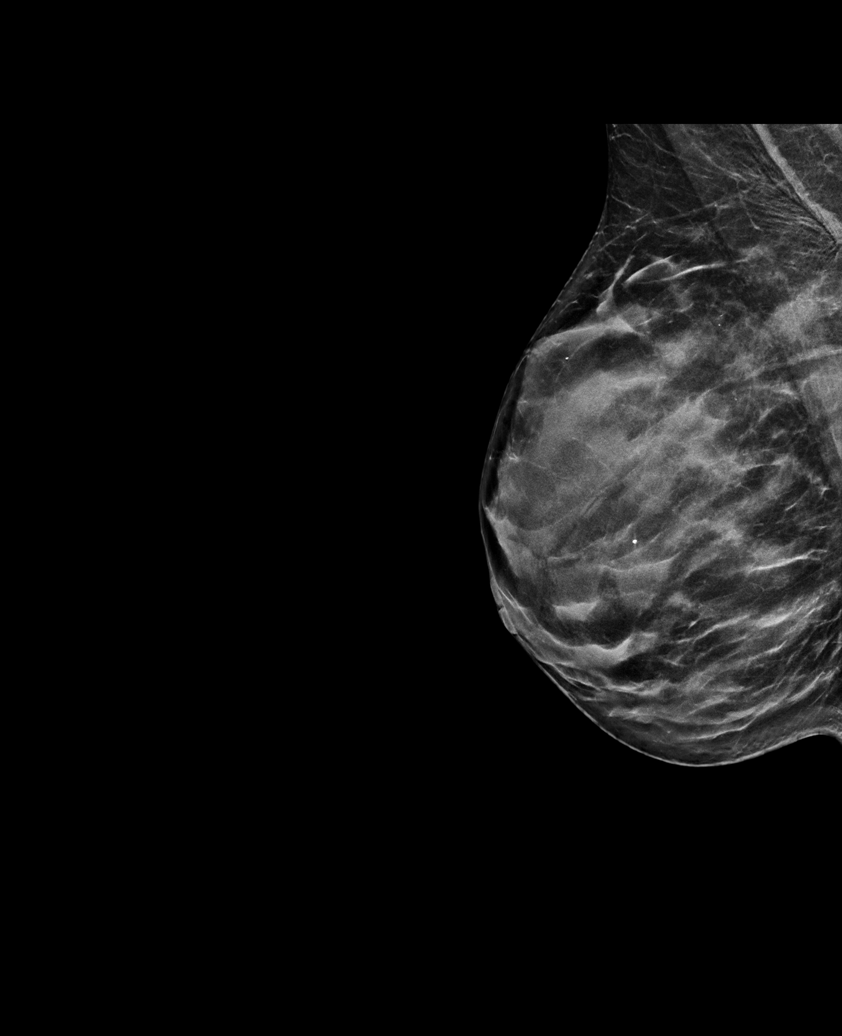

[L MLO synth-2D]
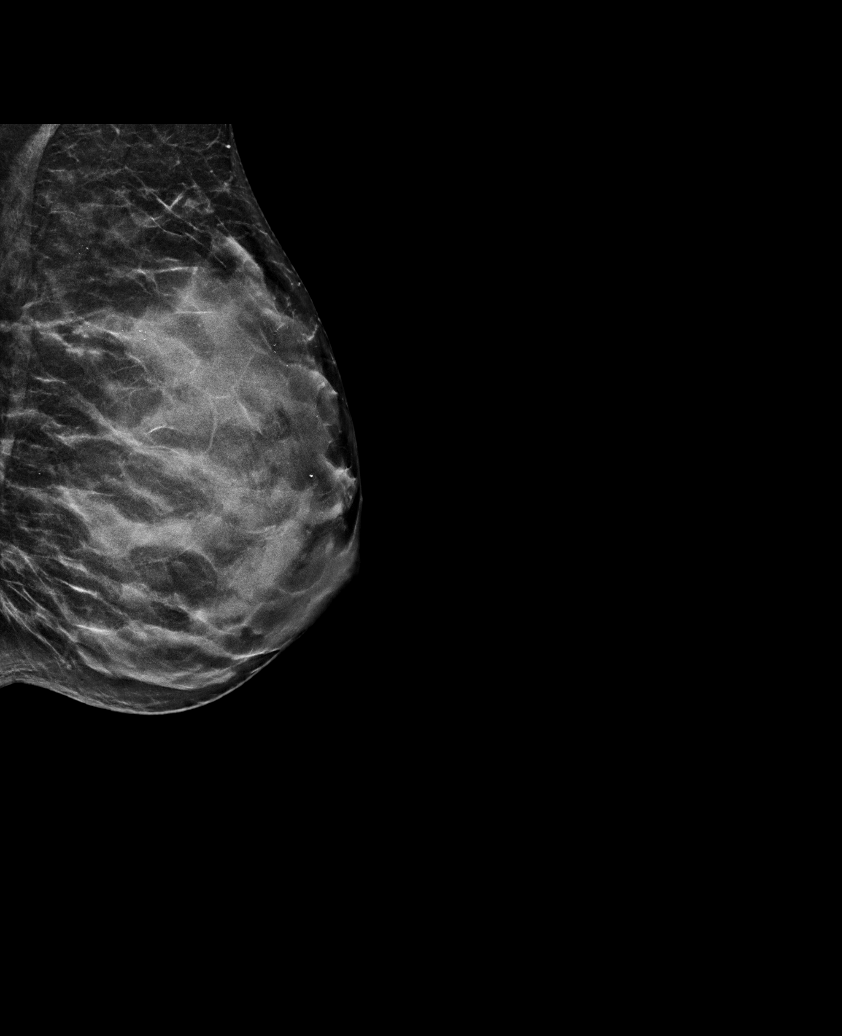

[R CC synth-2D (1 of 2)]
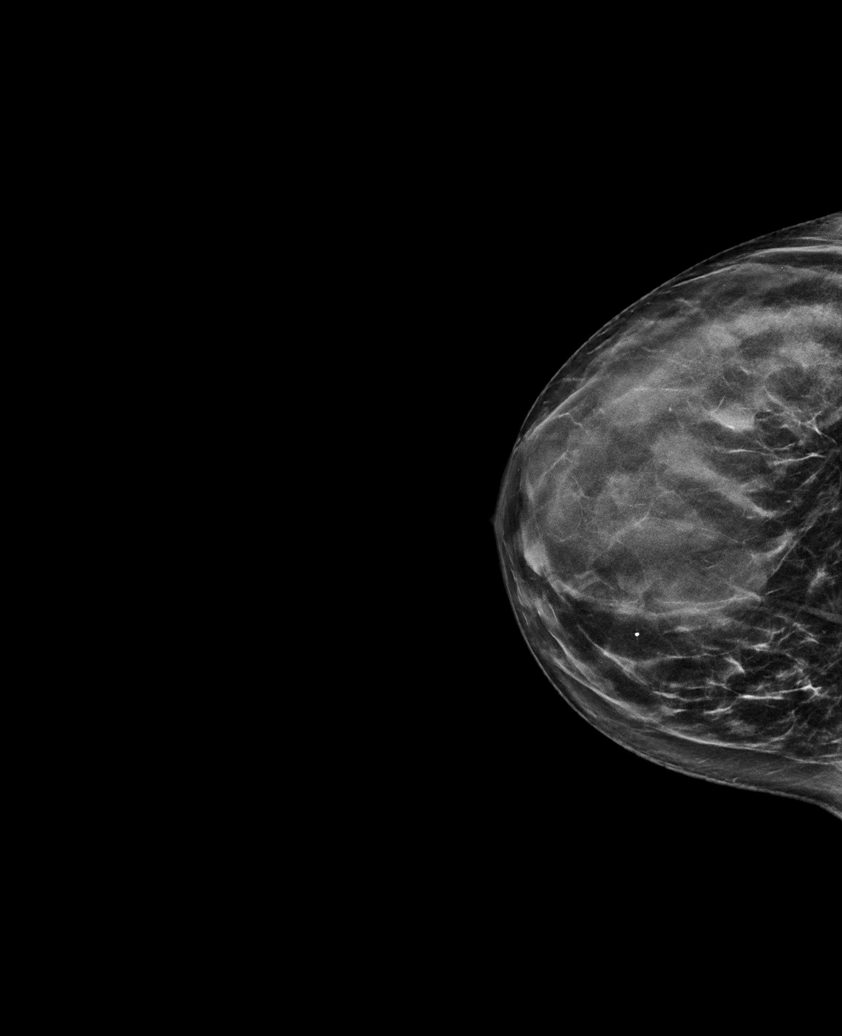

[L CC synth-2D]
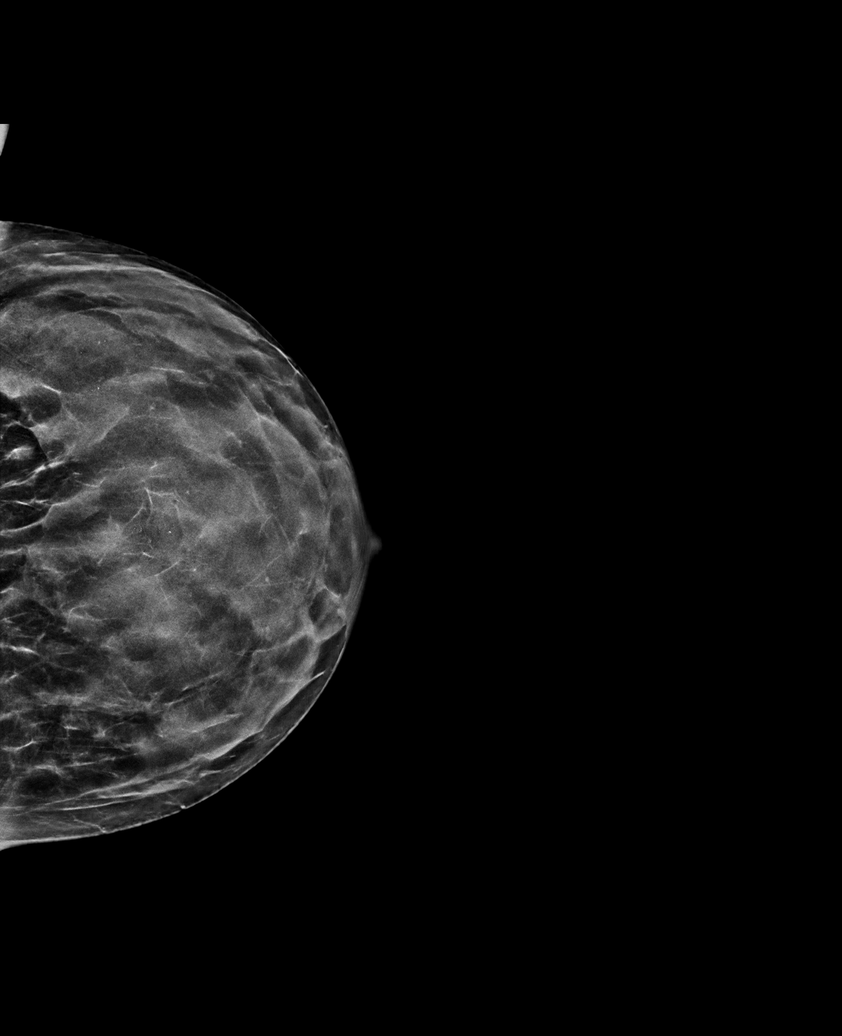

[R CC synth-2D (2 of 2)]
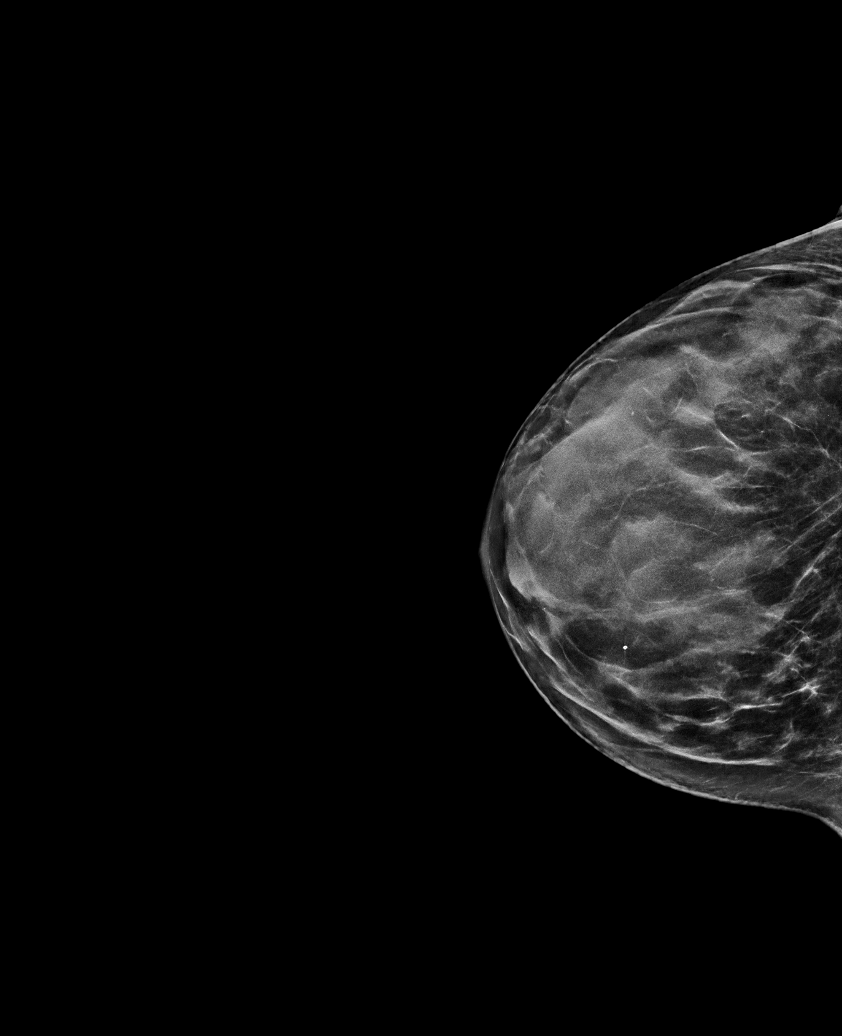

[L MLO tomo · tomo slice 31/60.0]
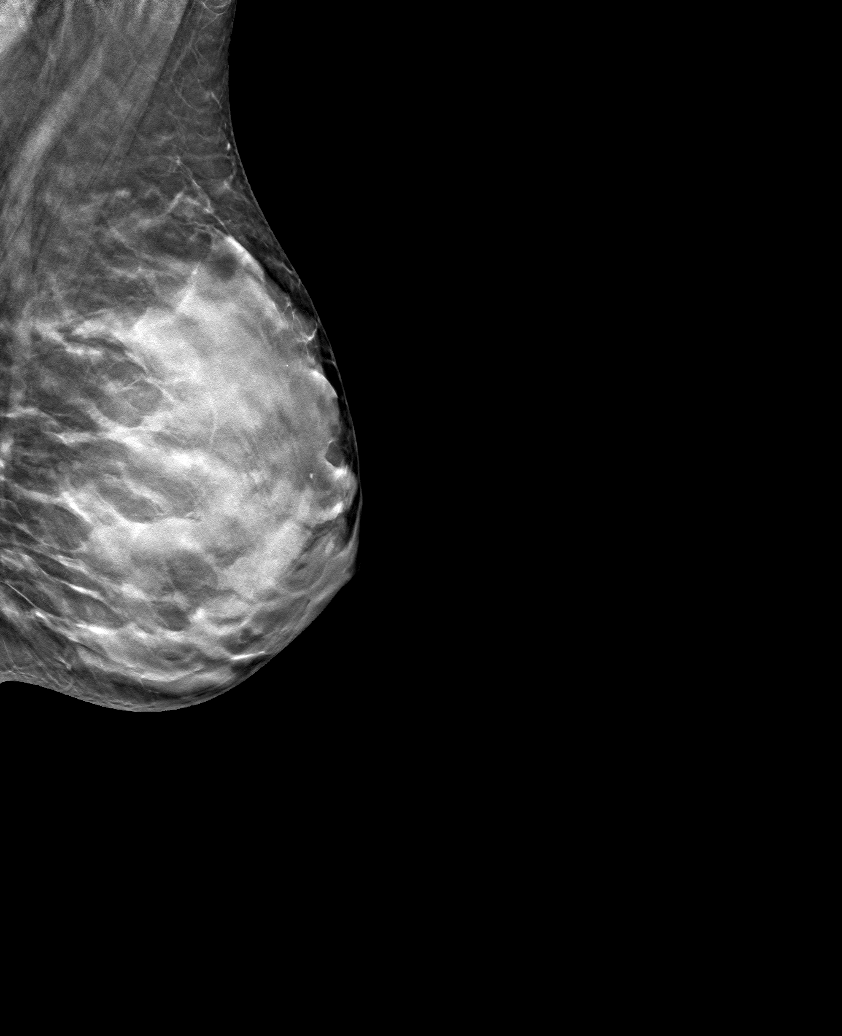

[6 of 30 positions shown; findings below may reference images not displayed]

ACR Breast Density Category d: The breast tissue is extremely dense,
which lowers the sensitivity of mammography.
FINDINGS: No suspicious masses or calcifications seen in either breast. There
is no mammographic evidence of malignancy in either breast.
IMPRESSION: No mammographic evidence of malignancy in either breast.

RECOMMENDATION:
1. Strong family history of breast cancer including in mother as
well as paternal grandmother having been diagnosed with breast
cancer. Consider genetic counseling if this has not already been
performed as well as screening breast MRI given the strong family
history along with extremely dense fibroglandular tissue.

2.  Screening mammogram in one year.(Code:W1-D-B0L)

I have discussed the findings and recommendations with the patient.
If applicable, a reminder letter will be sent to the patient
regarding the next appointment.

BI-RADS CATEGORY  1: Negative.

## 2022-11-24 DIAGNOSIS — K5090 Crohn's disease, unspecified, without complications: Secondary | ICD-10-CM | POA: Diagnosis not present

## 2022-12-20 DIAGNOSIS — Z79899 Other long term (current) drug therapy: Secondary | ICD-10-CM | POA: Diagnosis not present

## 2022-12-20 DIAGNOSIS — K5090 Crohn's disease, unspecified, without complications: Secondary | ICD-10-CM | POA: Diagnosis not present

## 2023-01-19 DIAGNOSIS — K5090 Crohn's disease, unspecified, without complications: Secondary | ICD-10-CM | POA: Diagnosis not present

## 2023-01-25 ENCOUNTER — Ambulatory Visit: Payer: BC Managed Care – PPO | Admitting: Gastroenterology

## 2023-01-29 DIAGNOSIS — Z720 Tobacco use: Secondary | ICD-10-CM | POA: Diagnosis not present

## 2023-03-08 DIAGNOSIS — H5213 Myopia, bilateral: Secondary | ICD-10-CM | POA: Diagnosis not present

## 2023-03-08 DIAGNOSIS — H524 Presbyopia: Secondary | ICD-10-CM | POA: Diagnosis not present

## 2023-03-22 DIAGNOSIS — K5090 Crohn's disease, unspecified, without complications: Secondary | ICD-10-CM | POA: Diagnosis not present

## 2023-04-05 ENCOUNTER — Ambulatory Visit: Payer: BC Managed Care – PPO | Admitting: Gastroenterology

## 2023-05-17 DIAGNOSIS — K5090 Crohn's disease, unspecified, without complications: Secondary | ICD-10-CM | POA: Diagnosis not present

## 2023-06-06 DIAGNOSIS — R946 Abnormal results of thyroid function studies: Secondary | ICD-10-CM | POA: Diagnosis not present

## 2023-06-06 DIAGNOSIS — R7309 Other abnormal glucose: Secondary | ICD-10-CM | POA: Diagnosis not present

## 2023-06-06 DIAGNOSIS — Z13220 Encounter for screening for lipoid disorders: Secondary | ICD-10-CM | POA: Diagnosis not present

## 2023-06-06 DIAGNOSIS — Z79899 Other long term (current) drug therapy: Secondary | ICD-10-CM | POA: Diagnosis not present

## 2023-06-06 DIAGNOSIS — E559 Vitamin D deficiency, unspecified: Secondary | ICD-10-CM | POA: Diagnosis not present

## 2023-06-20 DIAGNOSIS — K5090 Crohn's disease, unspecified, without complications: Secondary | ICD-10-CM | POA: Diagnosis not present

## 2023-06-20 DIAGNOSIS — F17200 Nicotine dependence, unspecified, uncomplicated: Secondary | ICD-10-CM | POA: Diagnosis not present

## 2023-06-20 DIAGNOSIS — Z87442 Personal history of urinary calculi: Secondary | ICD-10-CM | POA: Diagnosis not present

## 2023-06-20 DIAGNOSIS — Z0000 Encounter for general adult medical examination without abnormal findings: Secondary | ICD-10-CM | POA: Diagnosis not present

## 2023-07-13 DIAGNOSIS — K5090 Crohn's disease, unspecified, without complications: Secondary | ICD-10-CM | POA: Diagnosis not present

## 2023-07-14 ENCOUNTER — Encounter: Payer: Self-pay | Admitting: Gastroenterology

## 2023-07-14 ENCOUNTER — Ambulatory Visit: Payer: BC Managed Care – PPO | Admitting: Gastroenterology

## 2023-07-14 VITALS — BP 120/78 | HR 99 | Ht 62.0 in | Wt 150.2 lb

## 2023-07-14 DIAGNOSIS — K50818 Crohn's disease of both small and large intestine with other complication: Secondary | ICD-10-CM

## 2023-07-14 DIAGNOSIS — K50919 Crohn's disease, unspecified, with unspecified complications: Secondary | ICD-10-CM

## 2023-07-14 NOTE — Progress Notes (Signed)
 06/16/2022 Amanda Klein [id number] [birth year]   Review of pertinent gastrointestinal problems: 1. Crohn's ileocolitis; diagnosed 2012 elsewhere.  Establish care with me October 2019.  She believes she started Remicade around 2013 colonoscopy February 2014 [facility] found deep ulcer at the [employer].  Also "erythema and innumerable deep large serpentine ulcers from the sigmoid through the transverse colon.  Few aphthous ulcers in the a sending and cecum.  Few aphthous ulcers in the terminal ileum just inside the IC valve.  Path showed active inflammation in the terminal ileum and right colon, severe active inflammation of the transverse, focal active inflammation in the rectum.  Negative for granulomas throughout however the pathologist commented that the features were suggestive of IBD, Crohn's disease EGD February 2014 [facility] normal except for mild erythema and erosions in the gastric body. Colonoscopy December 2015 done for "follow-up of Crohn's disease".showed "multiple scars in the transverse colon, splenic flexure, descending colon, and sigmoid colon.  Terminal ileum was not intubated. Remicade 5 mg/kg every 8 weeks. Infliximab antibodies NEGATIVE 11/2019 Infliximab concentration 24 11/2019 TB quant gold - 01/2021   HISTORY OF PRESENT ILLNESS: This is a 48 year old female who is a patient of Dr. Christella Hartigan.  She switched back from [place] to Remicade around May 2023 shortly after she was seen here last.  Her infliximab concentration levels were on the lower side and she felt like she did not do as well with the [place].  Since switching back to the Remicade she is doing well on 5 mg/kg every 8 weeks.  She says that she has 1 formed bowel movement after drinking coffee in the morning.  No rectal bleeding.  No abdominal pain.  Her next infusion is 07/28/22.       Past Medical History:  Diagnosis Date   Crohn disease (HCC)     Kidney stones                Past Surgical History:  Procedure Laterality Date   CARPAL TUNNEL RELEASE Right     KNOT EXCISION Left      forehead above eye, age 60   URETHRA SURGERY             reports that she has been smoking cigarettes. She has a 26.00 pack-year smoking history. She has never used smokeless tobacco. She reports current alcohol use. She reports current drug use. Drug: Marijuana. family history includes Breast cancer in her mother and paternal grandmother; Heart disease in her father; Hyperlipidemia in her brother; Skin cancer in her father. Allergies  No Known Allergies           Outpatient Encounter Medications as of 06/16/2022  Medication Sig   inFLIXimab (REMICADE) 100 MG injection Inject into the vein every 8 (eight) weeks.   pantoprazole (PROTONIX) 40 MG tablet TAKE 1 TABLET BY MOUTH EVERY DAY (Patient taking differently: Take 40 mg by mouth daily as needed.)   fluticasone (FLONASE) 50 MCG/ACT nasal spray Place 1 spray into both nostrils daily. (Patient not taking: Reported on 06/16/2022)      No facility-administered encounter medications on file as of 06/16/2022.        REVIEW OF SYSTEMS  : All other systems reviewed and negative except where noted in the History of Present Illness.     PHYSICAL EXAM: BP 118/78   Pulse 98   Ht 5\' 2"  (1.575 m)   Wt 145 lb (65.8 kg)   SpO2 97%  BMI 26.52 kg/m  General: Well developed white female in no acute distress Head: Normocephalic and atraumatic Eyes:  Sclerae anicteric, conjunctiva pink. Ears: Normal auditory acuity Lungs: Clear throughout to auscultation; no W/R/R. Heart: Regular rate and rhythm; no M/R/G. Abdomen: Soft, non-distended.  BS present.  Non-tender. Musculoskeletal: Symmetrical with no gross deformities  Skin: No lesions on visible extremities Extremities: No edema  Neurological: Alert oriented x 4, grossly non-focal Psychological:  Alert and cooperative. Normal mood and affect   ASSESSMENT AND PLAN: 48 y.o. female  with Crohn's ileocolitis: She is doing well back on the Remicade brand-name, 5 mg/kg every 8 weeks.  No complaints.  Will check CBC, CMP, sed rate, CRP, and infliximab antibody and concentration levels just prior to her next infusion.  Will check quantiferon gold as well.  She will return for those labs at that time.      Attending physician's note   I have taken history, reviewed the chart and examined the patient. I performed a substantive portion of this encounter, including complete performance of at least one of the key components, in conjunction with the APP. I agree with the Advanced Practitioner's note, impression and recommendations.    No problems today. No nausea, vomiting, heartburn, regurgitation, odynophagia or dysphagia.  No significant diarrhea or constipation.  No melena or hematochezia. No unintentional weight loss. No abdominal pain. 1 BM/day Doesnot want any vaccines.  Exam: normal  Colon 07/2022: (Dr Orvan Falconer) - Anal papilla( e) were hypertrophied. - Scarred mucosa in the entire examined colon. - One 2 mm polyp in the sigmoid colon, removed with a cold snare. Resected and retrieved. - One 4 mm polyp in the cecum, removed with a cold snare. Resected and retrieved. - The examination was otherwise normal on direct and retroflexion views. -Bx: neg for active crohns. Bx from polyps: SSA -Rpt 3 yrs  Labs: INF 4.4, neg Ab (07/26/2022). Nl CBC, CMP, CRP, sed rate. Neg TB gold   Plan: -Continue INF 5mg /kg Q8 weeks -FU 1 year. -Rpt colon 08/2025 as per last colon letter -Call if any problems.    Edman Circle, MD [facility] [phone]

## 2023-07-14 NOTE — Patient Instructions (Signed)
 _______________________________________________________  If your blood pressure at your visit was 140/90 or greater, please contact your primary care physician to follow up on this.  _______________________________________________________  If you are age 48 or older, your body mass index should be between 23-30. Your Body mass index is 27.47 kg/m. If this is out of the aforementioned range listed, please consider follow up with your Primary Care Provider.  If you are age 20 or younger, your body mass index should be between 19-25. Your Body mass index is 27.47 kg/m. If this is out of the aformentioned range listed, please consider follow up with your Primary Care Provider.   ________________________________________________________  The [facility] providers would like to encourage you to use [facility] to communicate with providers for non-urgent requests or questions.  Due to long hold times on the telephone, sending your provider a message by [facility] may be a faster and more efficient way to get a response.  Please allow 48 business hours for a response.  Please remember that this is for non-urgent requests.  _______________________________________________________  Continue infusions  Please follow up in 12 months. Give [place] a call at [phone] to schedule an appointment.  Repeat colonoscopy for 08-2025. Please call 2 months prior to schedule this. A letter will be sent as it gets closer.  Thank you,  Dr. Lynann Bologna

## 2023-09-06 ENCOUNTER — Other Ambulatory Visit: Payer: Self-pay | Admitting: Family Medicine

## 2023-09-06 DIAGNOSIS — Z1231 Encounter for screening mammogram for malignant neoplasm of breast: Secondary | ICD-10-CM

## 2023-09-07 DIAGNOSIS — K5090 Crohn's disease, unspecified, without complications: Secondary | ICD-10-CM | POA: Diagnosis not present

## 2023-09-26 ENCOUNTER — Ambulatory Visit

## 2023-09-27 ENCOUNTER — Ambulatory Visit

## 2023-09-27 ENCOUNTER — Ambulatory Visit
Admission: RE | Admit: 2023-09-27 | Discharge: 2023-09-27 | Disposition: A | Discharge status: Home / Self Care | Source: Ambulatory Visit | Attending: Family Medicine | Admitting: Family Medicine

## 2023-09-27 DIAGNOSIS — Z1231 Encounter for screening mammogram for malignant neoplasm of breast: Secondary | ICD-10-CM

## 2023-11-10 DIAGNOSIS — K5090 Crohn's disease, unspecified, without complications: Secondary | ICD-10-CM | POA: Diagnosis not present

## 2023-11-29 DIAGNOSIS — R300 Dysuria: Secondary | ICD-10-CM | POA: Diagnosis not present

## 2023-11-29 DIAGNOSIS — R635 Abnormal weight gain: Secondary | ICD-10-CM | POA: Diagnosis not present

## 2023-12-14 DIAGNOSIS — R635 Abnormal weight gain: Secondary | ICD-10-CM | POA: Diagnosis not present

## 2023-12-14 DIAGNOSIS — K5090 Crohn's disease, unspecified, without complications: Secondary | ICD-10-CM | POA: Diagnosis not present

## 2023-12-20 DIAGNOSIS — Z79899 Other long term (current) drug therapy: Secondary | ICD-10-CM | POA: Diagnosis not present

## 2023-12-20 DIAGNOSIS — K5090 Crohn's disease, unspecified, without complications: Secondary | ICD-10-CM | POA: Diagnosis not present

## 2024-01-05 DIAGNOSIS — K5090 Crohn's disease, unspecified, without complications: Secondary | ICD-10-CM | POA: Diagnosis not present

## 2024-01-19 DIAGNOSIS — R300 Dysuria: Secondary | ICD-10-CM | POA: Diagnosis not present

## 2024-01-24 DIAGNOSIS — M79603 Pain in arm, unspecified: Secondary | ICD-10-CM | POA: Diagnosis not present

## 2024-01-24 DIAGNOSIS — R300 Dysuria: Secondary | ICD-10-CM | POA: Diagnosis not present

## 2024-01-24 DIAGNOSIS — M549 Dorsalgia, unspecified: Secondary | ICD-10-CM | POA: Diagnosis not present

## 2024-01-24 DIAGNOSIS — R1110 Vomiting, unspecified: Secondary | ICD-10-CM | POA: Diagnosis not present

## 2024-01-24 DIAGNOSIS — F17200 Nicotine dependence, unspecified, uncomplicated: Secondary | ICD-10-CM | POA: Diagnosis not present

## 2024-01-25 DIAGNOSIS — N261 Atrophy of kidney (terminal): Secondary | ICD-10-CM | POA: Diagnosis not present

## 2024-01-25 DIAGNOSIS — N200 Calculus of kidney: Secondary | ICD-10-CM | POA: Diagnosis not present

## 2024-01-25 DIAGNOSIS — M549 Dorsalgia, unspecified: Secondary | ICD-10-CM | POA: Diagnosis not present

## 2024-01-31 ENCOUNTER — Other Ambulatory Visit: Payer: Self-pay | Admitting: Medical Genetics

## 2024-03-01 DIAGNOSIS — K5090 Crohn's disease, unspecified, without complications: Secondary | ICD-10-CM | POA: Diagnosis not present

## 2024-04-26 DIAGNOSIS — K5090 Crohn's disease, unspecified, without complications: Secondary | ICD-10-CM | POA: Diagnosis not present

## 2024-06-05 ENCOUNTER — Other Ambulatory Visit: Payer: Self-pay | Admitting: Medical Genetics

## 2024-06-05 DIAGNOSIS — Z006 Encounter for examination for normal comparison and control in clinical research program: Secondary | ICD-10-CM

## 2024-07-18 ENCOUNTER — Ambulatory Visit: Admitting: Gastroenterology
